# Patient Record
Sex: Female | Born: 1980 | Hispanic: Refuse to answer | Marital: Married | Smoking: Never smoker
Health system: Southern US, Community
[De-identification: ages and names within clinical notes are randomized; demographics above are authoritative.]

## PROBLEM LIST (undated history)

## (undated) DIAGNOSIS — O009 Unspecified ectopic pregnancy without intrauterine pregnancy: Secondary | ICD-10-CM

## (undated) HISTORY — PX: UNILATERAL SALPINGECTOMY: SHX6160

## (undated) HISTORY — DX: Unspecified ectopic pregnancy without intrauterine pregnancy: O00.90

---

## 2008-10-21 ENCOUNTER — Inpatient Hospital Stay: Payer: Self-pay | Admitting: Unknown Physician Specialty

## 2009-04-01 ENCOUNTER — Encounter: Payer: Self-pay | Admitting: Maternal and Fetal Medicine

## 2009-04-03 ENCOUNTER — Ambulatory Visit: Payer: Self-pay | Admitting: Maternal and Fetal Medicine

## 2009-06-13 ENCOUNTER — Ambulatory Visit: Payer: Self-pay | Admitting: Obstetrics and Gynecology

## 2009-06-17 ENCOUNTER — Ambulatory Visit: Payer: Self-pay | Admitting: Cardiovascular Disease

## 2009-06-27 ENCOUNTER — Encounter: Payer: Self-pay | Admitting: Maternal & Fetal Medicine

## 2009-08-06 ENCOUNTER — Other Ambulatory Visit: Payer: Self-pay

## 2009-08-12 ENCOUNTER — Observation Stay: Payer: Self-pay

## 2009-08-13 ENCOUNTER — Observation Stay: Payer: Self-pay | Admitting: Obstetrics and Gynecology

## 2009-08-22 ENCOUNTER — Inpatient Hospital Stay: Payer: Self-pay

## 2010-07-24 IMAGING — US US OB < 14 WEEKS - US OB TV
1 series · 17 of 28 positions shown · non-contrast
Comparison: none

REASON FOR EXAM: vaginal bleeding  LMP 8 weeks ago   Flex 2
COMMENTS:

[Series 1: us ob < 14 weeks - us ob tv · 17 of 93 slices shown]
[im 1/93]
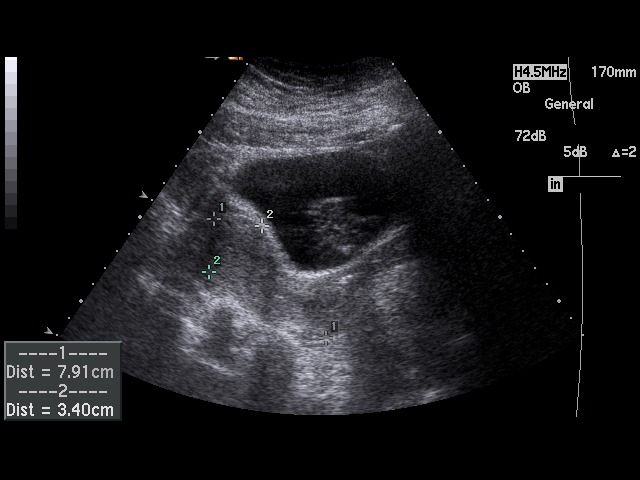
[im 7/93]
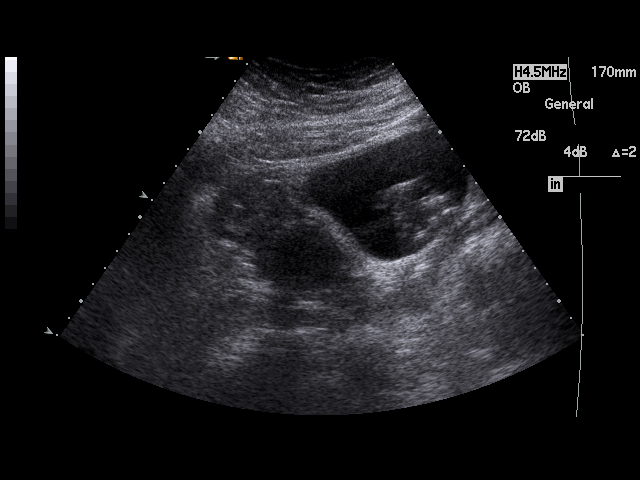
[im 14/93]
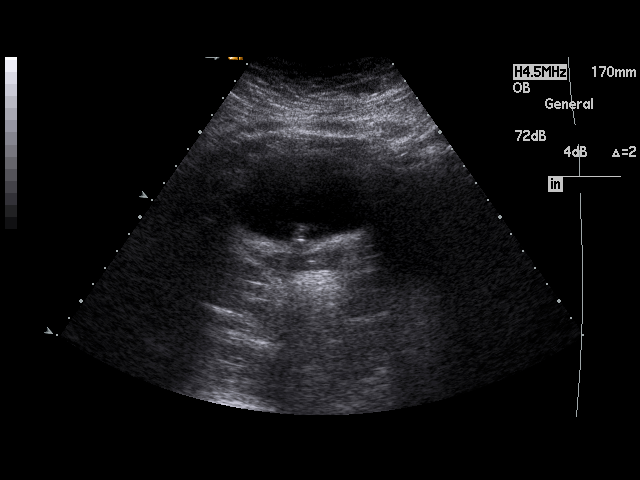
[im 18/93]
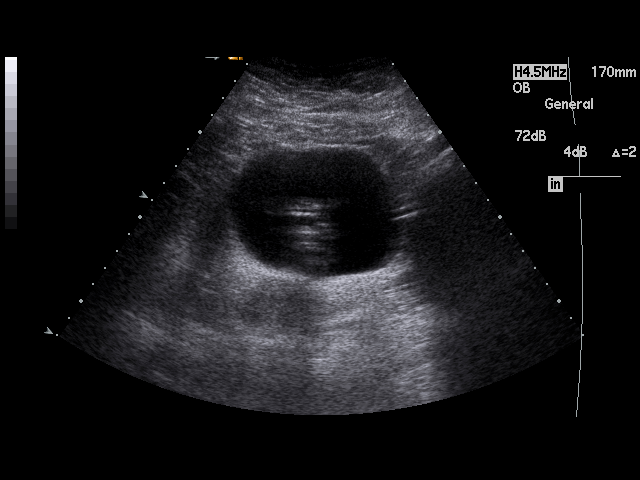
[im 24/93]
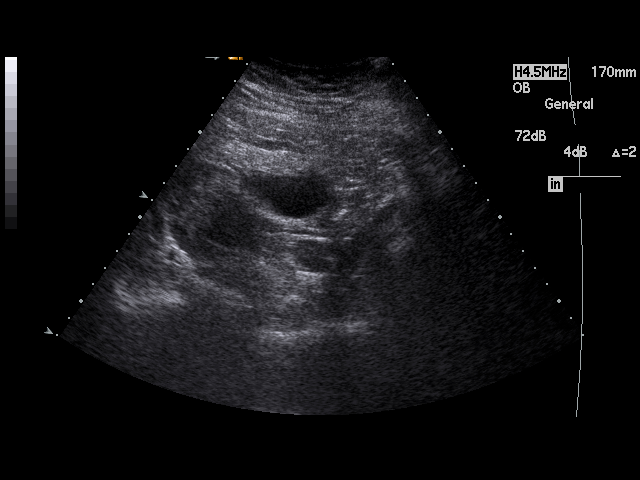
[im 31/93]
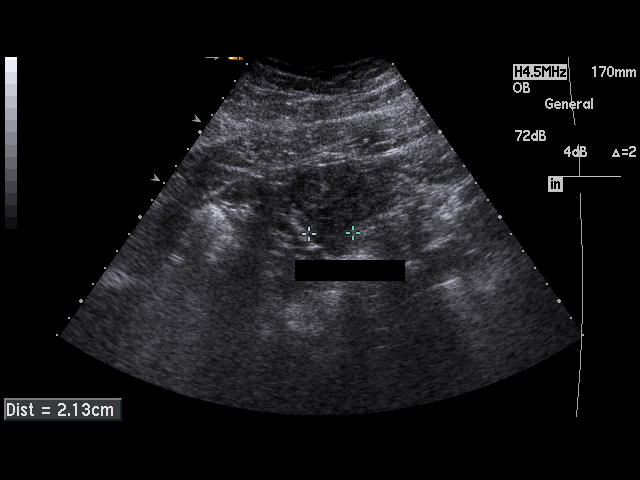
[im 35/93]
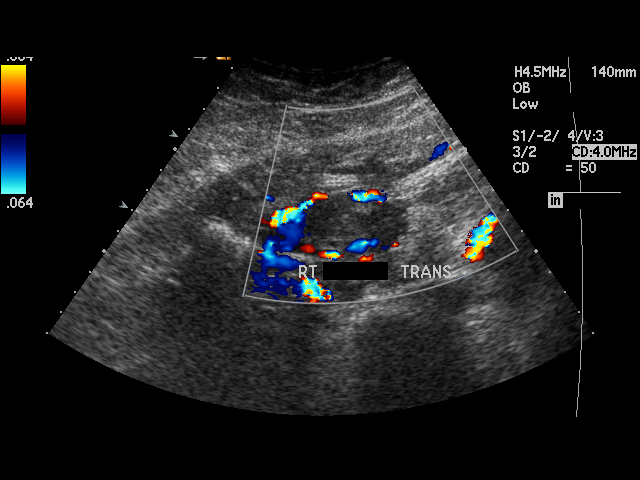
[im 41/93]
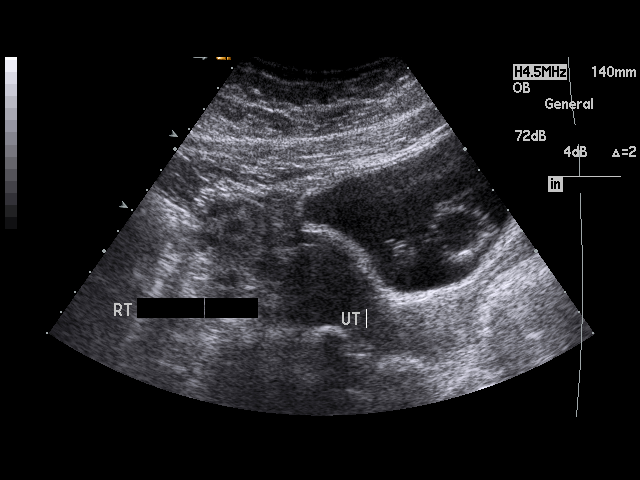
[im 48/93]
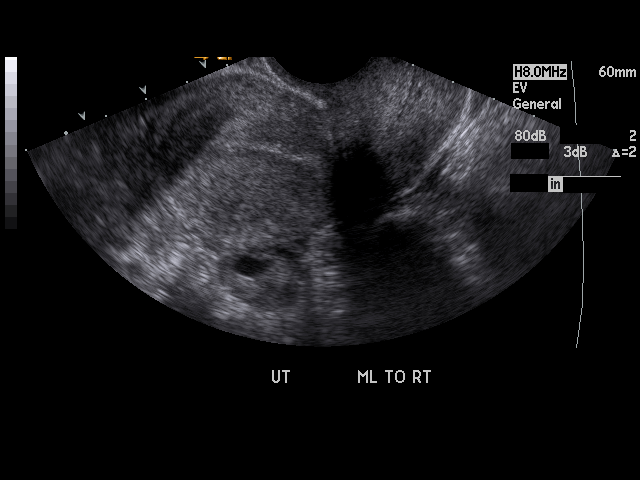
[im 52/93]
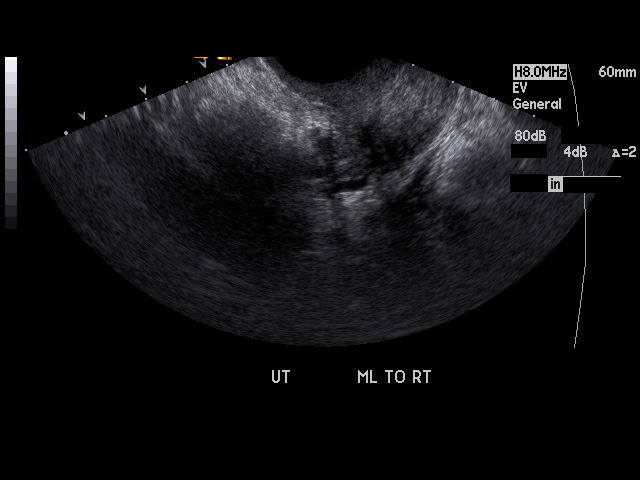
[im 58/93]
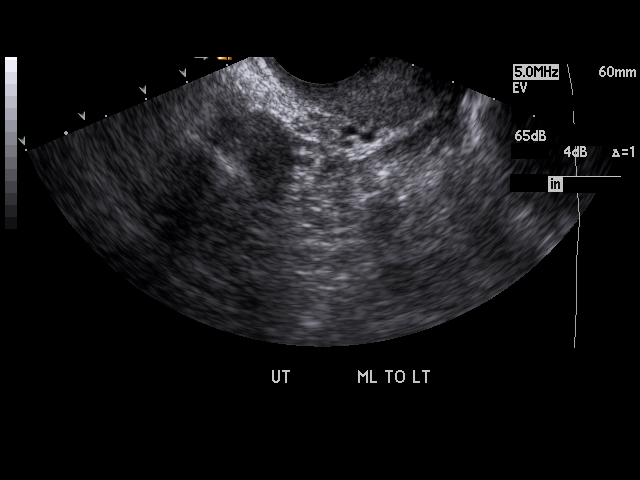
[im 62/93]
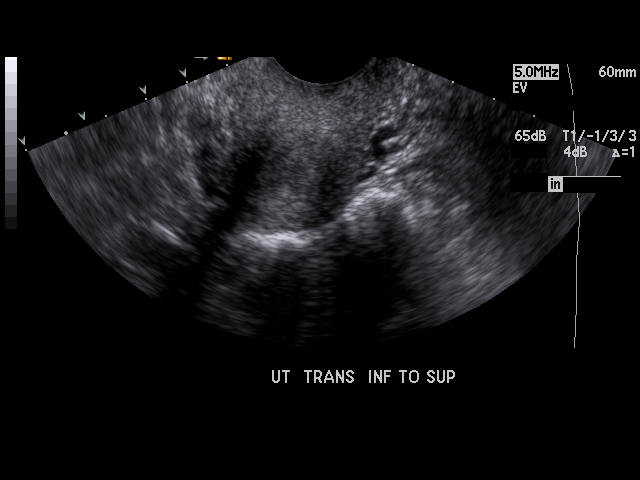
[im 69/93]
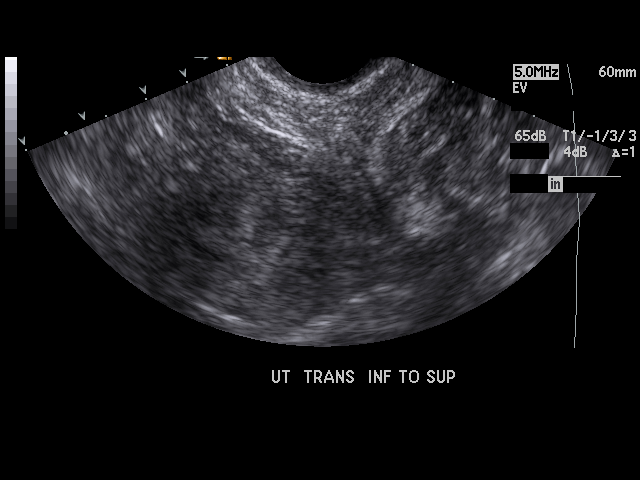
[im 75/93]
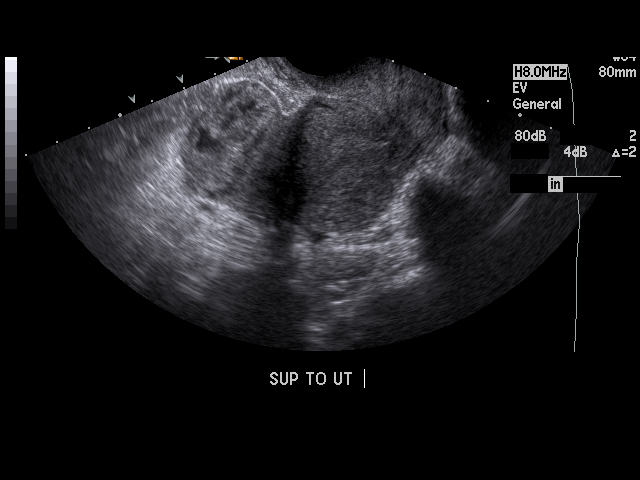
[im 79/93]
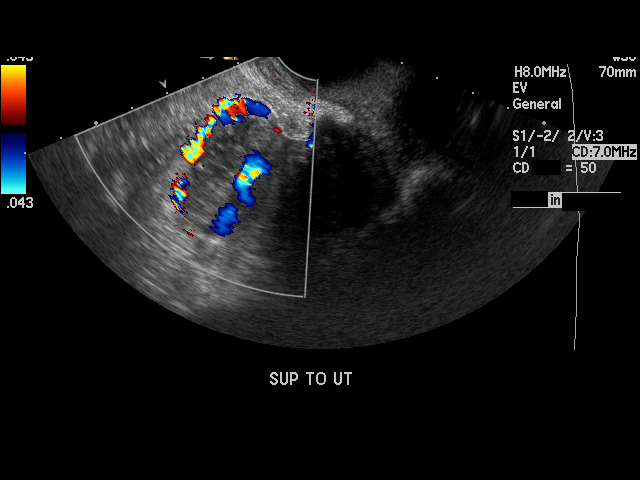
[im 86/93]
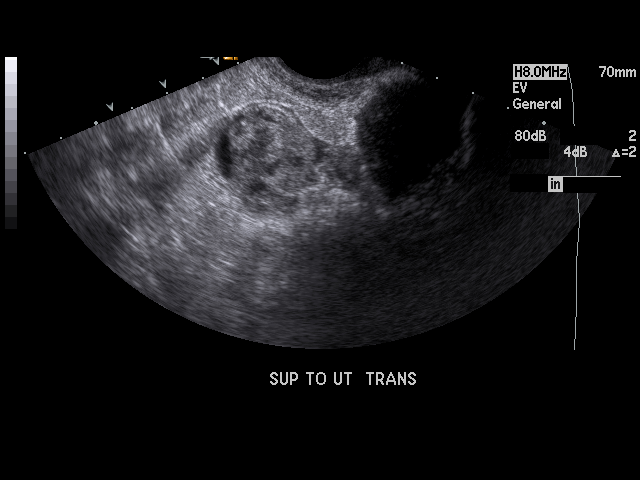
[im 93/93]
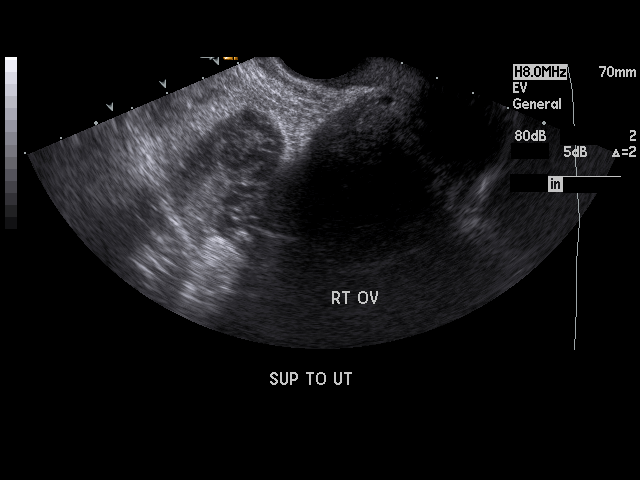

[17 of 28 positions shown; findings below may reference images not displayed]

PROCEDURE:     US  - US OB LESS THAN 14 WEEKS/W TRANS  - October 21, 2008  [DATE]

RESULT:     The uterus is empty. The endometrial stripe is not thickened. In
the right adnexal region there is a hypervascular area of heterogeneous
echogenicity that could reflect an ectopic pregnancy in the appropriate
clinical setting. The ovaries are demonstrated and appear normal in
echotexture and contour. The right ovary measures approximately 2.9 x 1.6 x
2.1 cm. The left ovary measures 1.9 x 1.5 x 1.9 cm. No significant free
fluid is demonstrated in the cul-de-sac.
IMPRESSION: There is complex appearing mixed echogenicity structure
that is hypervascular in the right adnexal region. In the appropriate
clinical setting this could reflect an early ectopic pregnancy. There is no
evidence of an IUP.
2. The ovaries are normal in echotexture and contour.

## 2011-01-02 IMAGING — US US OB DETAIL+14 WK - NRPT [facility]
1 series · 14 of 28 positions shown · non-contrast
Comparison: none

[Series 1: us ob detail+14 wk - nrpt [facility] · 14 of 78 slices shown]
[im 3/78]
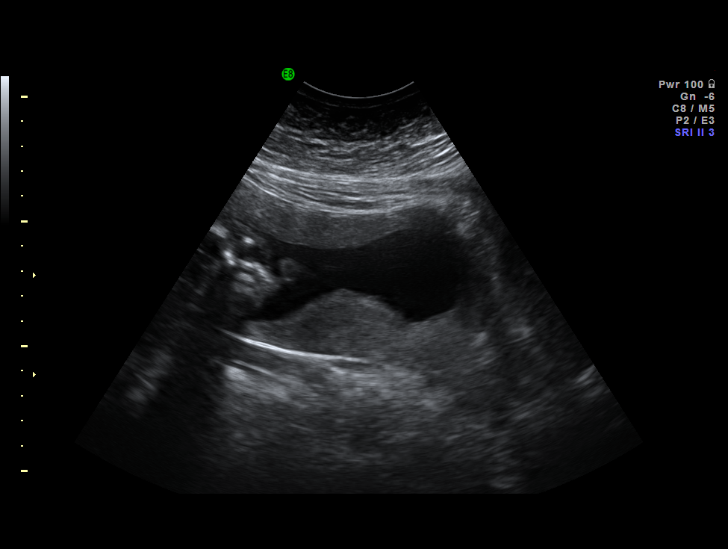
[im 9/78]
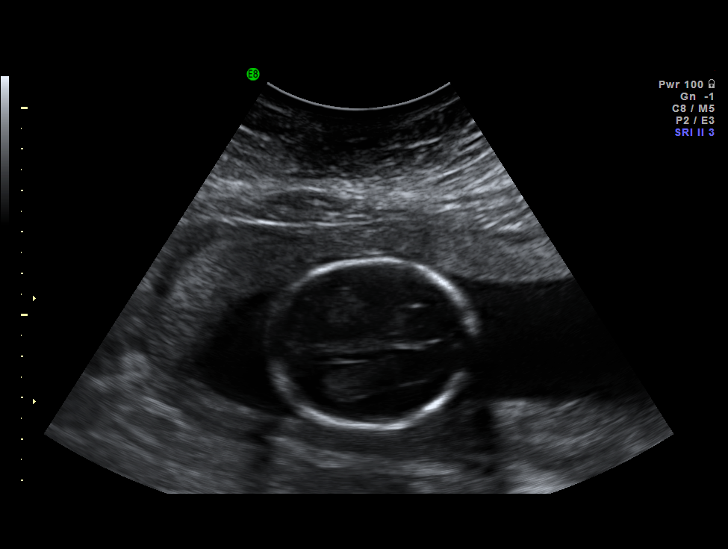
[im 15/78]
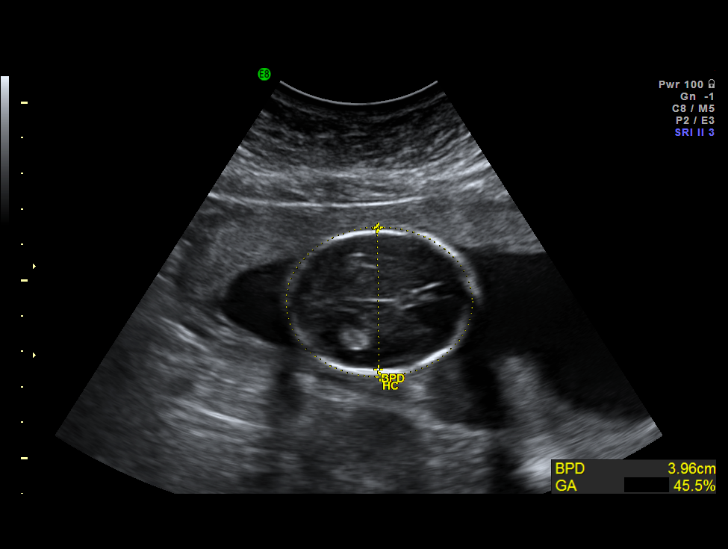
[im 20/78]
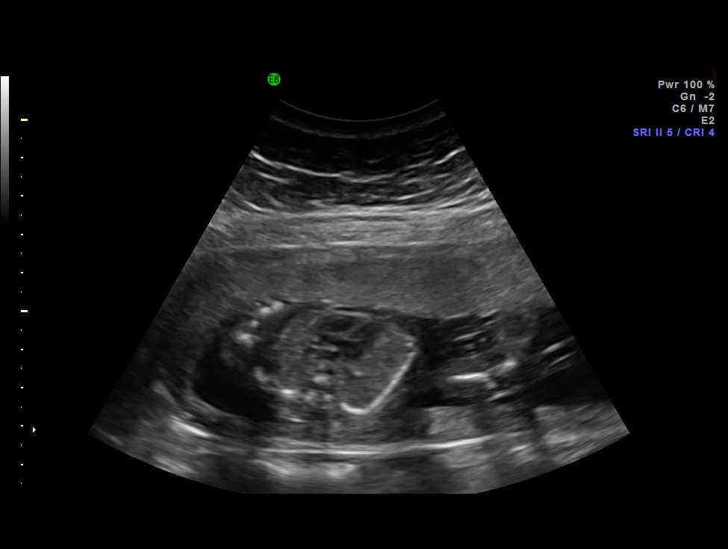
[im 26/78]
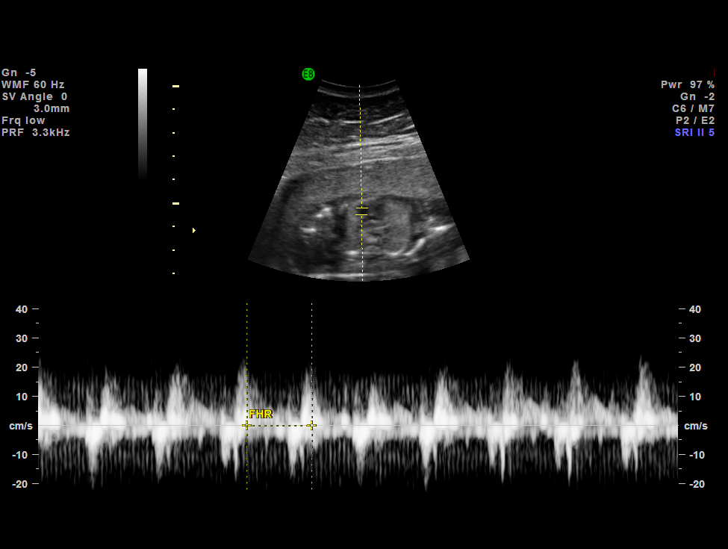
[im 32/78]
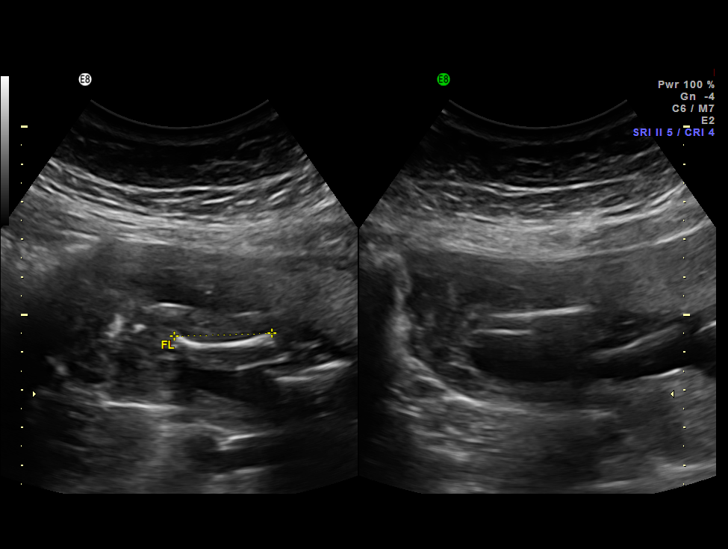
[im 38/78]
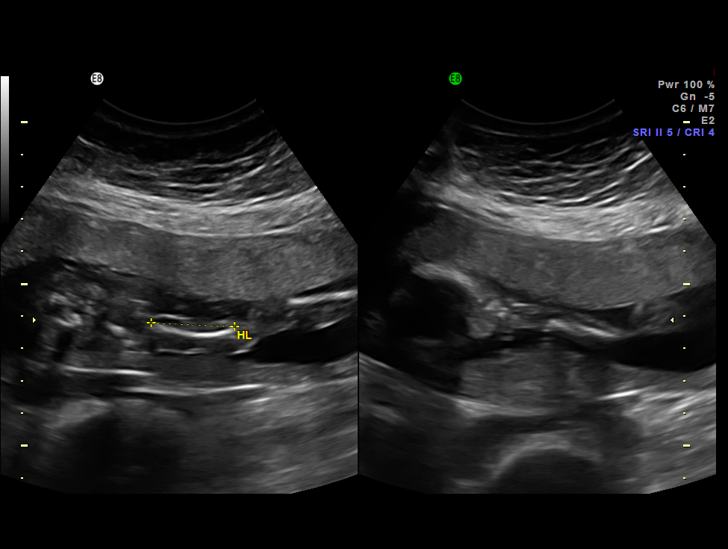
[im 43/78]
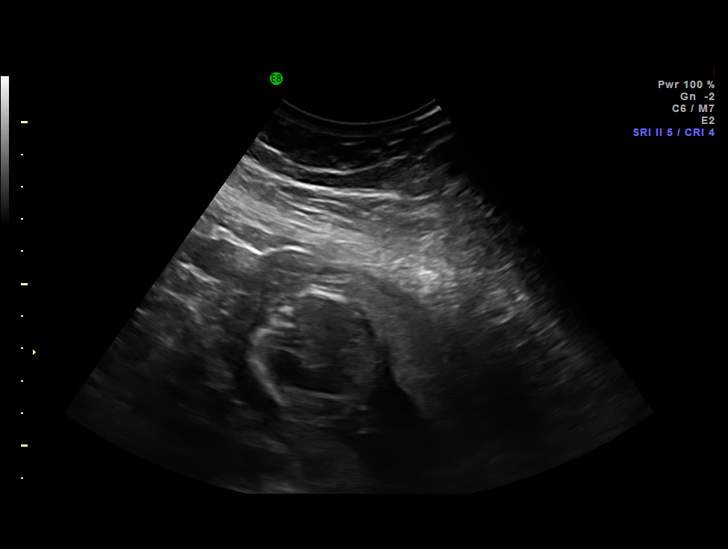
[im 49/78]
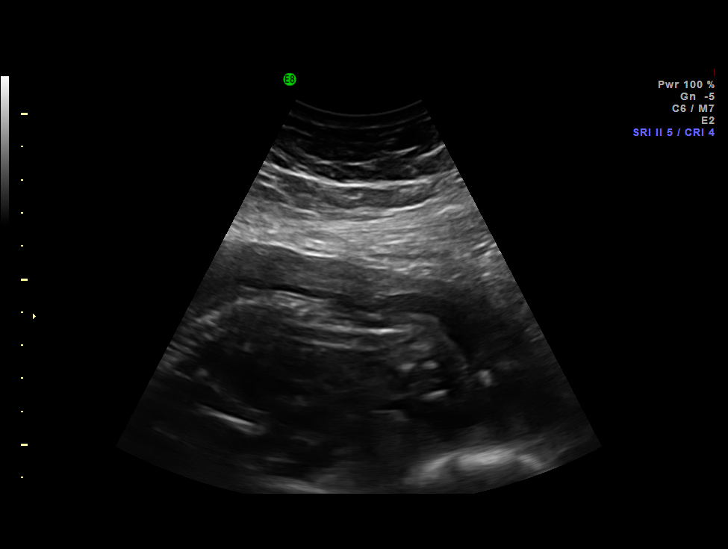
[im 55/78]
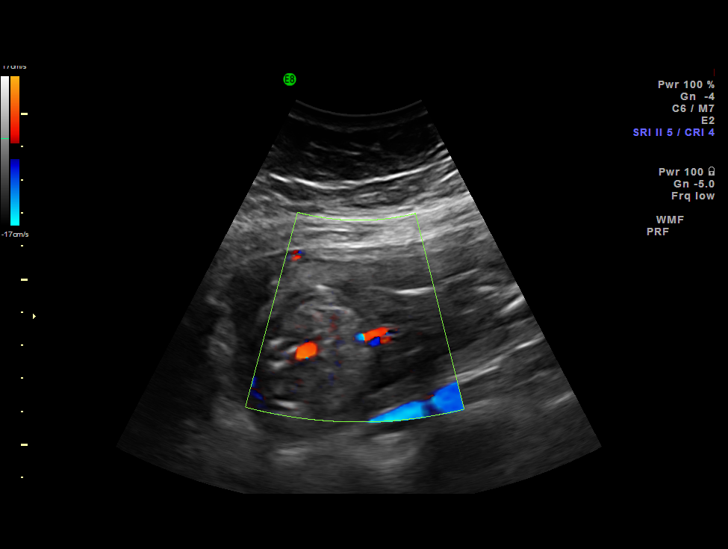
[im 60/78]
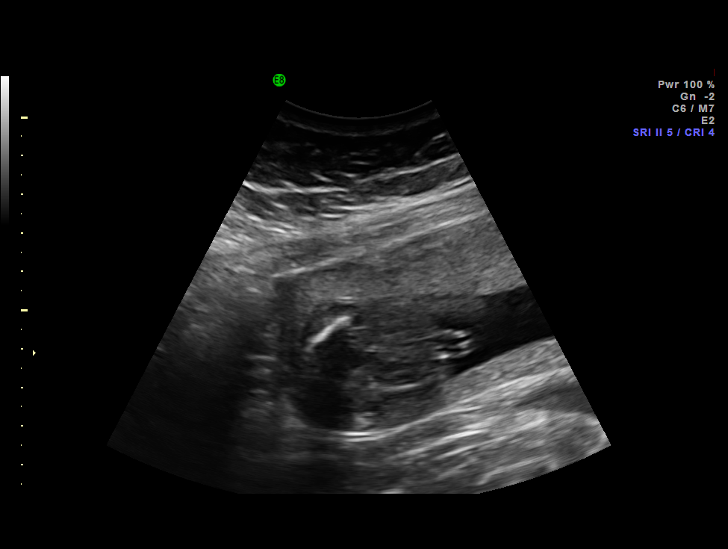
[im 66/78]
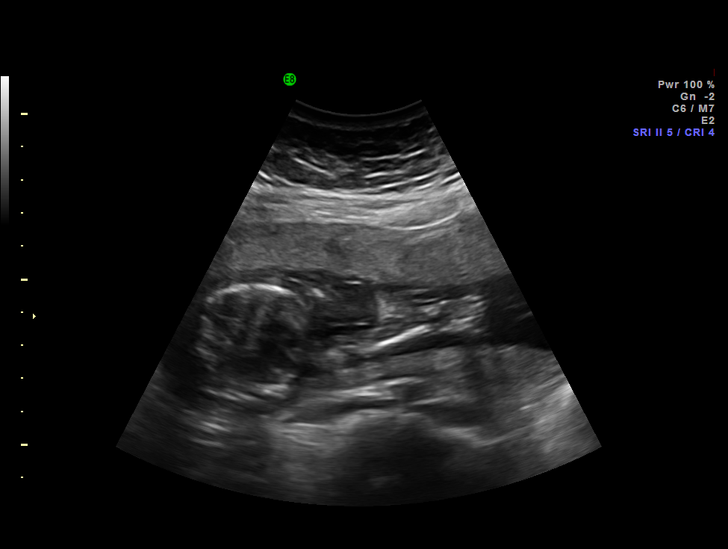
[im 72/78]
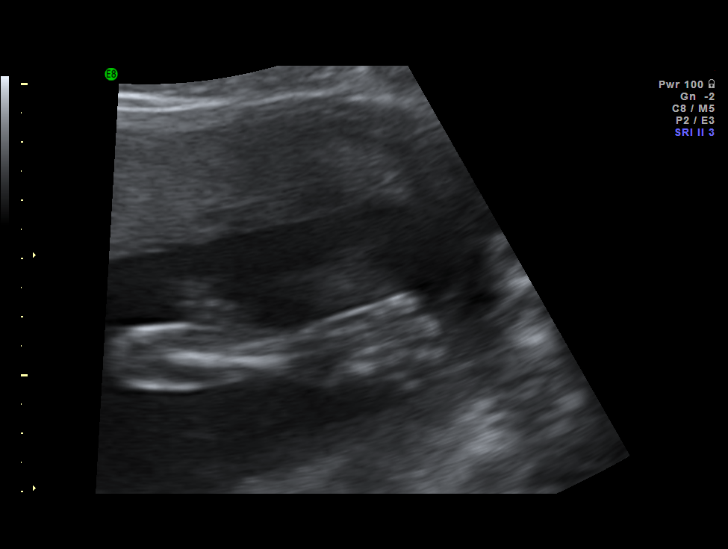
[im 78/78]
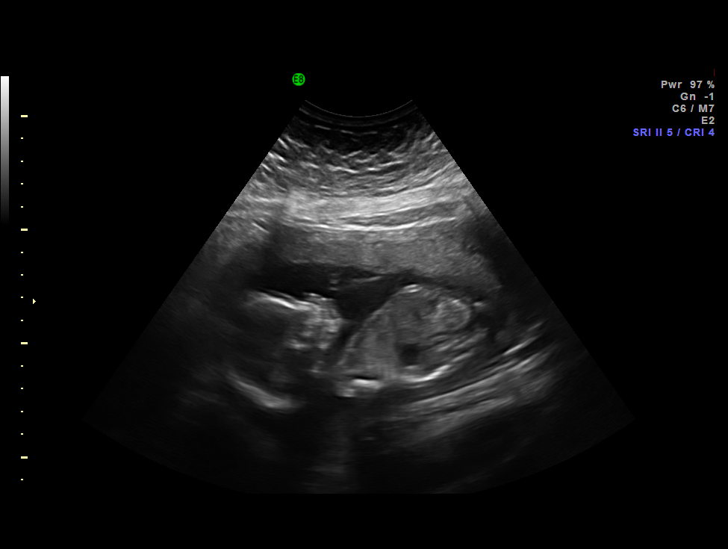

[14 of 28 positions shown; findings below may reference images not displayed]

IMAGES IMPORTED FROM THE SYNGO WORKFLOW SYSTEM
NO DICTATION FOR STUDY

## 2011-03-05 ENCOUNTER — Ambulatory Visit: Payer: Self-pay | Admitting: Family Medicine

## 2011-03-30 IMAGING — US US OB FOLLOW-UP - [facility]PT [facility]
1 series · 14 of 28 positions shown · non-contrast
Comparison: none

[Series 1: us ob follow-up - nrpt [facility] · 14 of 62 slices shown]
[im 3/62]
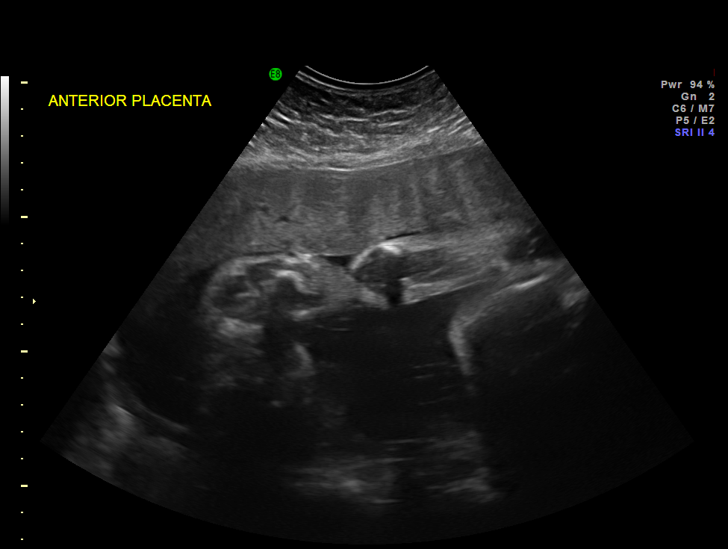
[im 7/62]
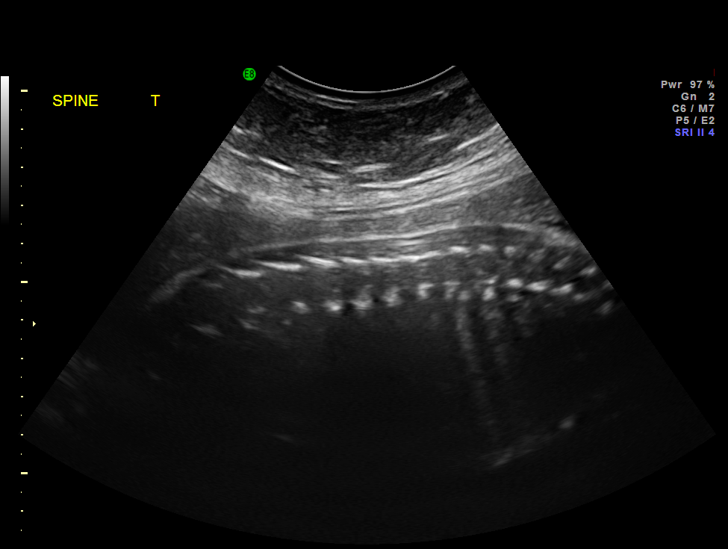
[im 12/62]
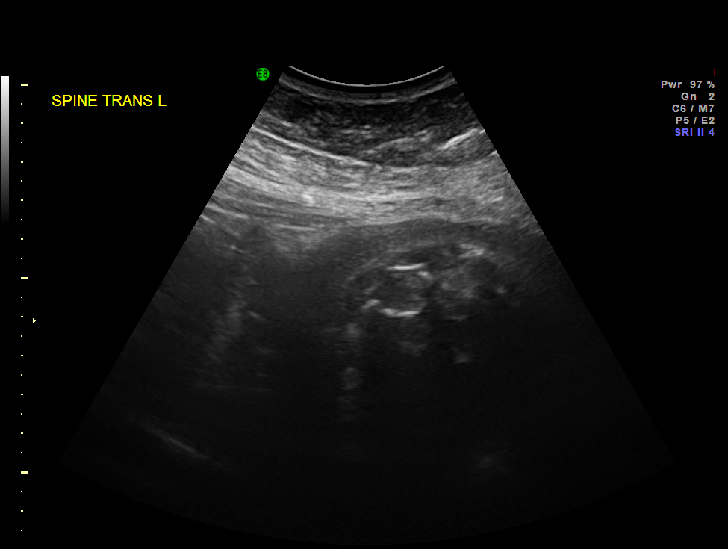
[im 16/62]
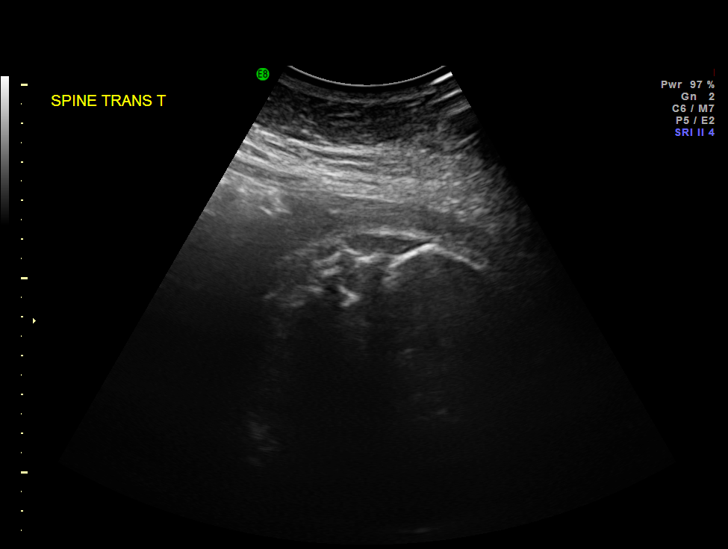
[im 21/62]
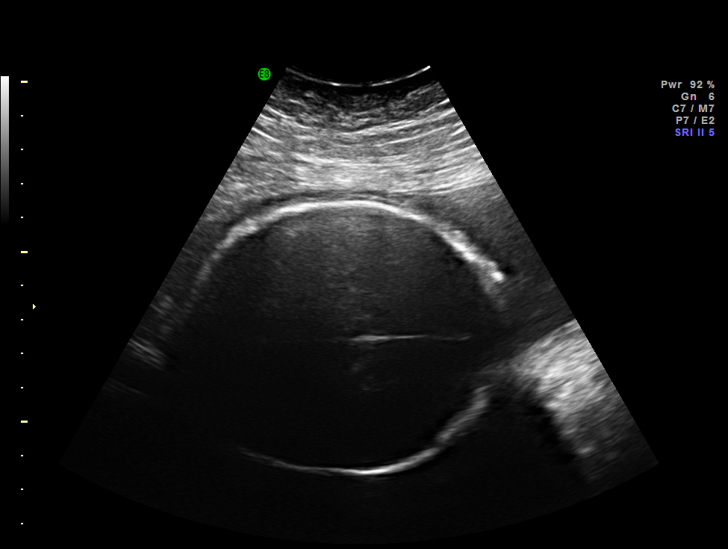
[im 25/62]
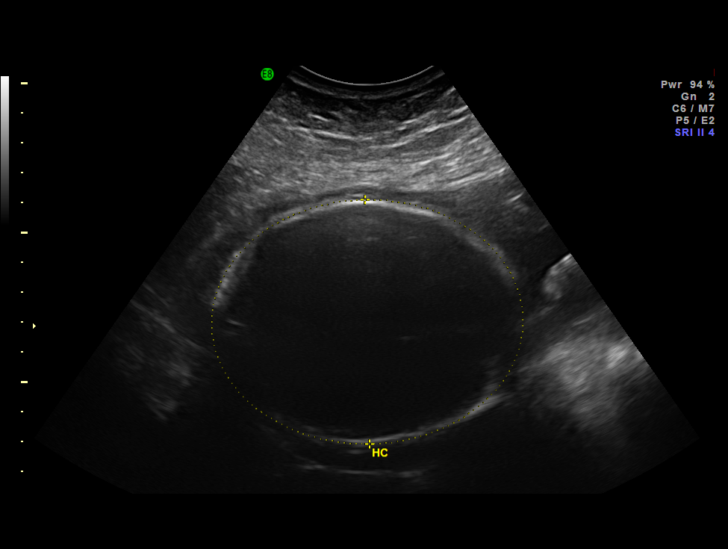
[im 30/62]
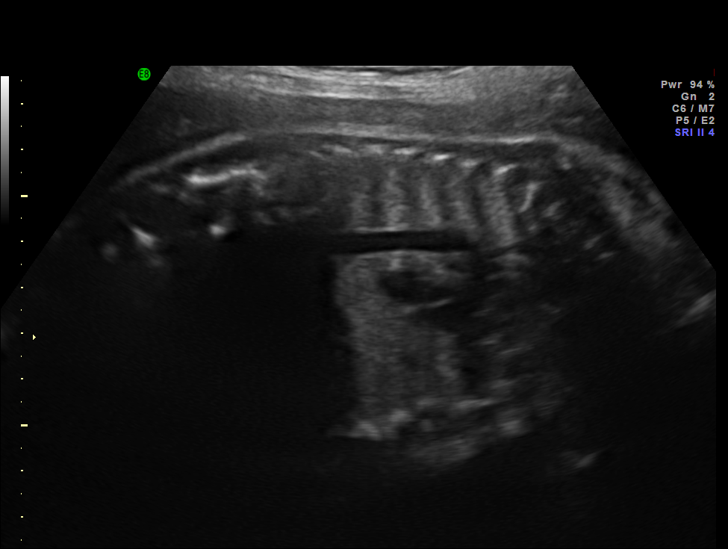
[im 34/62]
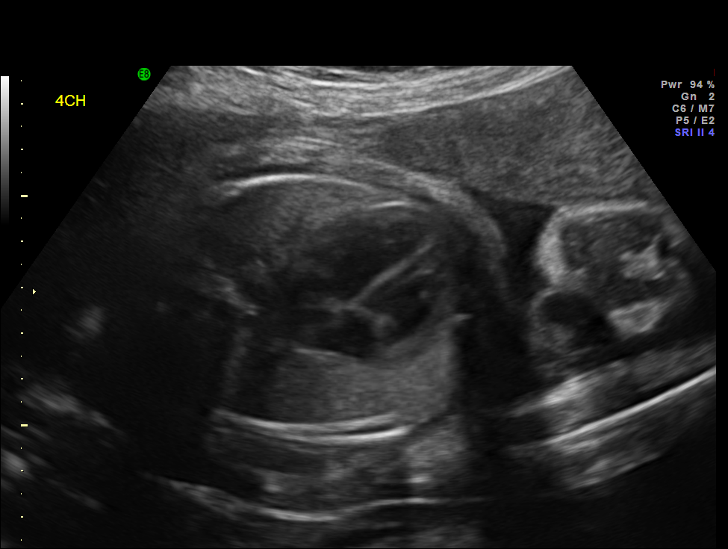
[im 39/62]
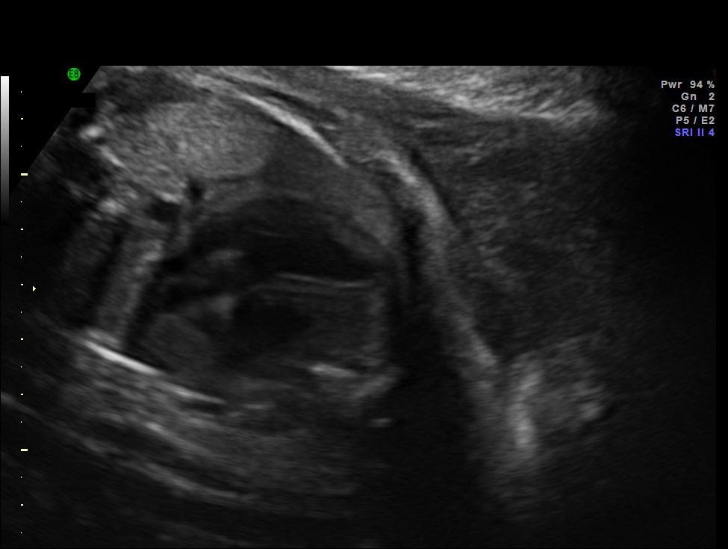
[im 43/62]
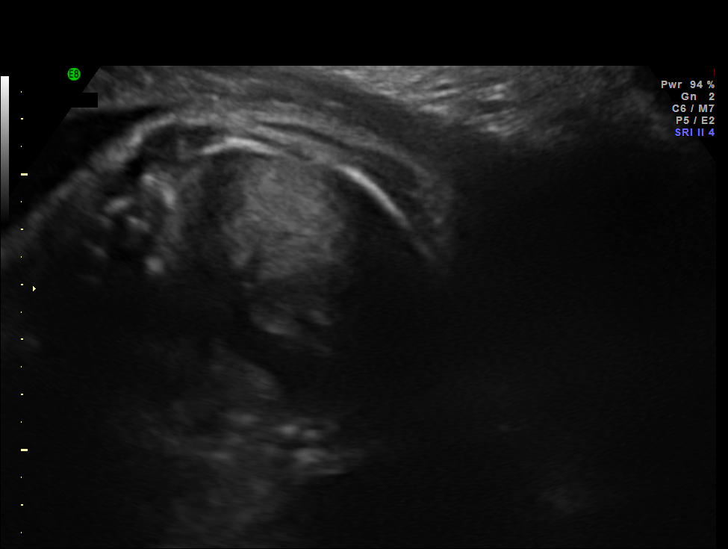
[im 48/62]
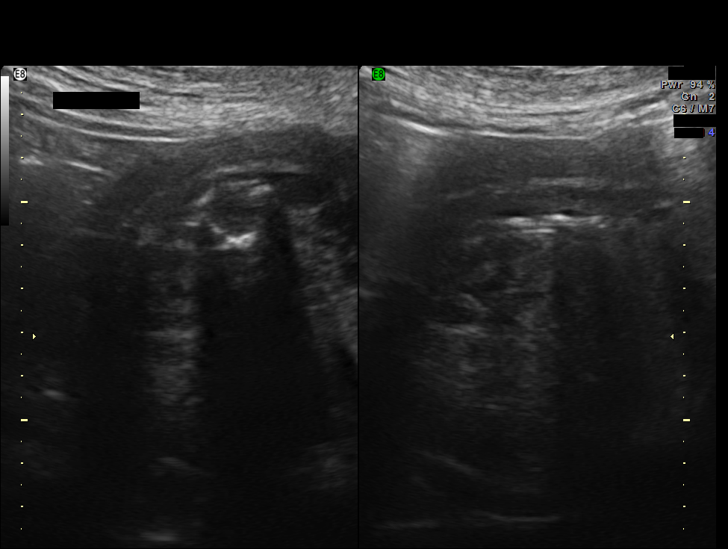
[im 52/62]
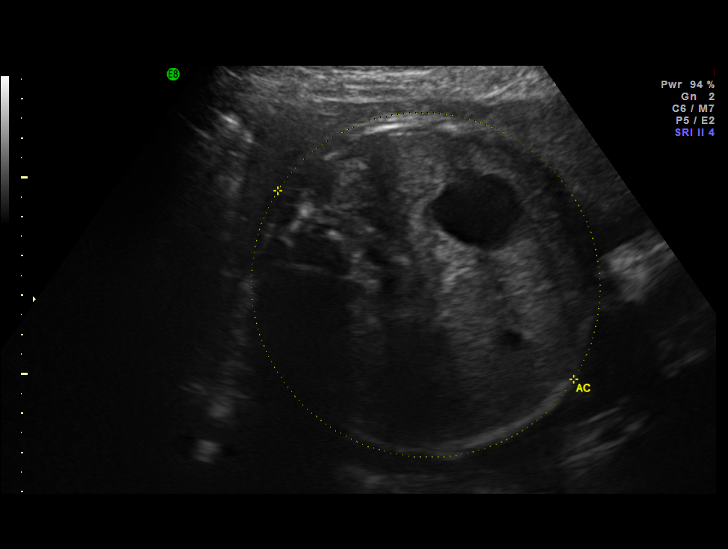
[im 57/62]
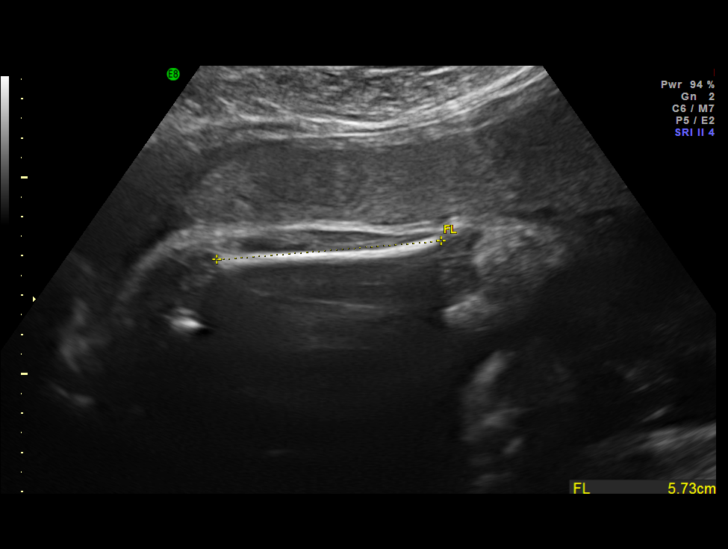
[im 62/62]
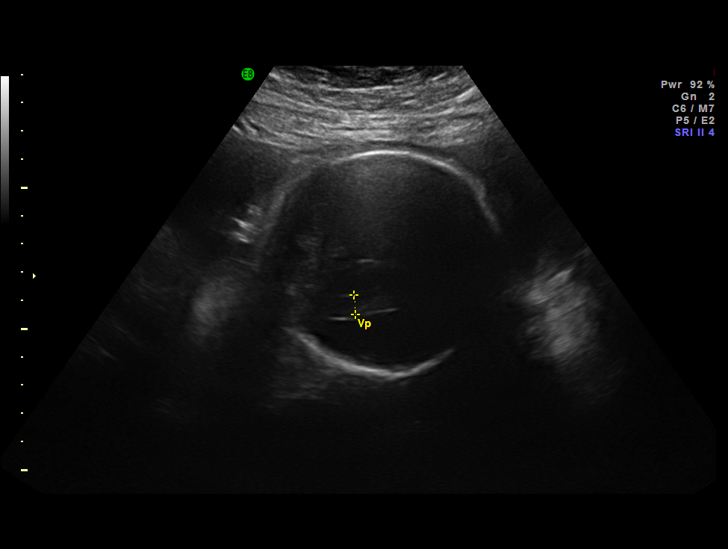

[14 of 28 positions shown; findings below may reference images not displayed]

IMAGES IMPORTED FROM THE SYNGO WORKFLOW SYSTEM
NO DICTATION FOR STUDY

## 2014-05-17 ENCOUNTER — Ambulatory Visit (INDEPENDENT_AMBULATORY_CARE_PROVIDER_SITE_OTHER): Payer: No Typology Code available for payment source | Admitting: Podiatry

## 2014-05-17 ENCOUNTER — Encounter: Payer: Self-pay | Admitting: Podiatry

## 2014-05-17 VITALS — Ht 64.0 in | Wt 193.0 lb

## 2014-05-17 DIAGNOSIS — L600 Ingrowing nail: Secondary | ICD-10-CM | POA: Diagnosis not present

## 2014-05-17 NOTE — Progress Notes (Signed)
   Subjective:    Patient ID: Emma Rush, female    DOB: 1980/06/24, 34 y.o.   MRN: [id number]  HPI 34 year old female presents the office today with complaints of ingrown toenails the right medial nail border. She states over the weekend she started having increasing pain to the area. She denies any drainage or pus coming from the area. No history of injury or trauma. No prior treatment. No other complaints at this time.   Review of Systems  All other systems reviewed and are negative.      Objective:   Physical Exam AAO x3, NAD DP/PT pulses palpable bilaterally, CRT less than 3 seconds Protective sensation intact with Simms Weinstein monofilament, vibratory sensation intact, Achilles tendon reflex intact There is evidence of ingrowing along the medial border the right hallux toenail with tenderness palpation overlying this area. There is no surrounding edema, erythema, increase in warmth, drainage/purulence. There is no other areas to the remaining nails. No other areas of tenderness to bilateral lower extremities. MMT 5/5, ROM WNL.  No open lesions or pre-ulcerative lesions.  No overlying edema, erythema, increase in warmth to bilateral lower extremities.  No pain with calf compression, swelling, warmth, erythema bilaterally.      Assessment & Plan:  34 year old female with symptomatic right medial hallux ingrown toenail -Treatment options were discussed including alternatives, risks, complications -At this time, the patient is requesting partial nail removal with chemical matricectomy to right medial hallux nail border. Risks and complications were discussed with the patient for which they understand and  verbally consent to the procedure. Under sterile conditions a total of 3 mL of a mixture of 2% lidocaine plain and 0.5% Marcaine plain was infiltrated in a hallux block fashion. Once anesthetized, the skin was prepped in sterile fashion. A tourniquet was then applied. Next  the  medial hallux nail border was then sharply excised making sure to remove the entire offending nail border. Once the nails were ensured to be removed area was debrided and the underlying skin was intact. There is no purulence identified in the procedure. Next phenol was then applied under standard conditions and copiously irrigated. Silvadene was applied. A dry sterile dressing was applied. After application of the dressing the tourniquet was removed and there is found to be an immediate capillary refill time to the digit. The patient tolerated the procedure well any complications. Post procedure instructions were discussed the patient for which he verbally understood. Follow-up in one week for nail check or sooner if any problems are to arise. Discussed signs/symptoms of infection and directed to call the office immediately should any occur or go directly to the emergency room. In the meantime, encouraged to call the office with any questions, concerns, changes symptoms.

## 2014-05-17 NOTE — Patient Instructions (Signed)

## 2014-05-22 ENCOUNTER — Ambulatory Visit: Payer: No Typology Code available for payment source | Admitting: Podiatry

## 2014-05-29 ENCOUNTER — Ambulatory Visit (INDEPENDENT_AMBULATORY_CARE_PROVIDER_SITE_OTHER): Payer: No Typology Code available for payment source | Admitting: Podiatry

## 2014-05-29 DIAGNOSIS — Z9889 Other specified postprocedural states: Secondary | ICD-10-CM | POA: Diagnosis not present

## 2014-05-29 DIAGNOSIS — Q828 Other specified congenital malformations of skin: Secondary | ICD-10-CM

## 2014-05-30 NOTE — Progress Notes (Signed)
Patient ID: Emma HerrlichChristina Rush, female   DOB: [birth year], 34 y.o.   MRN: [id number]  Subjective: Patient presents to the office today for follow up status post right medial hallux partial nail avulsion with chemical matrixectomy. She states she is doing well without any complaints. She was soaking in epsom salts until Friday when she discontinued it as she believes it was healed. She denies any pain to the area and denies any drainage or any surrounding redness. She also states that she had a small lesion on the bottom of her left foot that she went to look at while years. She states that she has pain in this area particular pressure and ambulation. No other complaints at this time.  Objective: AAO 3, NAD Neurovascular status unchanged Status post partial nail avulsion right medial hallux which is healed at this time. There is a scab overlying the procedure site. There is no tenderness to palpation overlying the area and there is no surrounding erythema, ascending cellulitis, drainage/purulence, malodor. On the left foot submetatarsal 2 there is a small punctate hyperkeratotic lesion. Upon debridement there is no underlying ulceration, drainage and there is no pinpoint bleeding or evidence of verruca. No other open lesions or pre-ulcerative lesions No pain with calf compression, swelling, warmth, erythema.   Assessment: Follow-up evaluation s/p nail removal; left porokeratosis  Plan: -Treatment options were discussed with patient include alternatives, risks, complications -At this time the procedure site on the right hallux nail appears to be healed. Continues monitoring signs or symptoms of infection and directed to monitor for any reoccurrence. -Lesion on the left sharply debrided without complications.  -Follow-up as needed. In the meantime call the office with any questions, concerns, change in symptoms.

## 2015-05-13 ENCOUNTER — Emergency Department: Payer: No Typology Code available for payment source

## 2015-05-13 ENCOUNTER — Encounter: Payer: Self-pay | Admitting: Emergency Medicine

## 2015-05-13 ENCOUNTER — Emergency Department
Admission: EM | Admit: 2015-05-13 | Discharge: 2015-05-13 | Disposition: A | Discharge status: Home / Self Care | Payer: No Typology Code available for payment source | Attending: Emergency Medicine | Admitting: Emergency Medicine

## 2015-05-13 DIAGNOSIS — W28XXXA Contact with powered lawn mower, initial encounter: Secondary | ICD-10-CM | POA: Insufficient documentation

## 2015-05-13 DIAGNOSIS — M26621 Arthralgia of right temporomandibular joint: Secondary | ICD-10-CM | POA: Diagnosis not present

## 2015-05-13 DIAGNOSIS — S0990XA Unspecified injury of head, initial encounter: Secondary | ICD-10-CM | POA: Diagnosis present

## 2015-05-13 DIAGNOSIS — Y9355 Activity, bike riding: Secondary | ICD-10-CM | POA: Insufficient documentation

## 2015-05-13 DIAGNOSIS — S0083XA Contusion of other part of head, initial encounter: Secondary | ICD-10-CM | POA: Insufficient documentation

## 2015-05-13 DIAGNOSIS — Y92009 Unspecified place in unspecified non-institutional (private) residence as the place of occurrence of the external cause: Secondary | ICD-10-CM | POA: Diagnosis not present

## 2015-05-13 DIAGNOSIS — Y999 Unspecified external cause status: Secondary | ICD-10-CM | POA: Diagnosis not present

## 2015-05-13 DIAGNOSIS — S0093XA Contusion of unspecified part of head, initial encounter: Secondary | ICD-10-CM

## 2015-05-13 NOTE — ED Notes (Signed)
States she was mowing on a hill and fell backwards  Hit head on ground  Having right jaw pain and tenderness to left thigh  Denies any LOC

## 2015-05-13 NOTE — ED Provider Notes (Signed)
[facility] Emergency Department Provider Note ____________________________________________  Time seen: 1559  I have reviewed the triage vital signs and the nursing notes.  HISTORY  Chief Complaint  Fall  HPI Emma Rush is a 35 y.o. female presents to the ED from personal vehicle, by her husband and child for evaluation of injury sustained following a fall off of her riding lawnmower. Patient describes that about an hour and half prior to arrival, she was using her riding a lot more in the backyard. She had approached an incline in the yard and recognized that the tractor was going to tip. She goal of the tractor, and fell backwards onto the ground. She recalls hitting the back of her head and likely clenching her jaw at the same time. She presents to the ED primarily with pain to the right jaw at the TMJ which is worsened by clenching teeth. She denies any nausea, vomiting, dizziness. She also denies any loss of consciousness, distal paresthesias, or other injuries. She was ambulatory to the house after the fall, was able to call person on the phone and relayed details of the accident to him. She presents today with pain primarily to the right jaw described as tightness, that she rates at a4/10 in triage.  History reviewed. No pertinent past medical history.  There are no active problems to display for this patient.  History reviewed. No pertinent past surgical history.  No current outpatient prescriptions on file.  Allergies Review of patient's allergies indicates no known allergies.  No family history on file.  Social History Social History  Substance Use Topics  . Smoking status: Never Smoker   . Smokeless tobacco: Never Used  . Alcohol Use: No   Review of Systems  Constitutional: Negative for fever. Eyes: Negative for visual changes. ENT: Negative for sore throat. Cardiovascular: Negative for chest pain. Respiratory: Negative for shortness of  breath. Gastrointestinal: Negative for abdominal pain, vomiting and diarrhea. Musculoskeletal: Negative for back pain. right jaw pain as above.  Skin: Negative for rash. Neurological: Negative for headaches, focal weakness or numbness. ____________________________________________  PHYSICAL EXAM:  VITAL SIGNS: ED Triage Vitals  Enc Vitals Group     BP 05/13/15 1541 149/100 mmHg     Pulse Rate 05/13/15 1541 66     Resp 05/13/15 1541 18     Temp 05/13/15 1541 99 F (37.2 C)     Temp Source 05/13/15 1541 Oral     SpO2 05/13/15 1541 100 %     Weight 05/13/15 1541 195 lb (88.451 kg)     Height 05/13/15 1541 5\' 4"  (1.626 m)     Head Cir --      Peak Flow --      Pain Score 05/13/15 1541 4     Pain Loc --      Pain Edu? --      Excl. in GC? --    Constitutional: Alert and oriented4. Well appearing and in no distress. Head: Normocephalic and atraumatic. No bruise, abrasion, hematoma noted to the crown of the head. Patient with normal articulation of the jaw without crepitus. She is mildly tender to palpation over the right tragus.       Eyes: Conjunctivae are normal. PERRL. Normal extraocular movements and fundi bilaterally.       Ears: Canals clear. TMs intact bilaterally.   Nose: No congestion/rhinorrhea.   Mouth/Throat: Mucous membranes are moist. uvula is midline and tonsils are flat. No malocclusion of the lower jaw is appreciated.  Neck: Supple. No thyromegaly. Hematological/Lymphatic/Immunological: No cervical lymphadenopathy. Cardiovascular: Normal rate, regular rhythm.  Respiratory: Normal respiratory effort. No wheezes/rales/rhonchi. Gastrointestinal: Soft and nontender. No distention no rebound, guarding, organomegaly . Musculoskeletal: normal spinal alignment without midline tenderness, spasm, deformity, or step-off. Normal transition from sit to stand without difficulty. Patient with normal flexion and extension range at the spine. Nontender with normal range of  motion in all extremities.  Neurologic: cranial nerves II through XII grossly intact. Normal DTRs of the UE & LE bilaterally. Normal intrinsic and opposition testing. No cerebellar ataxia on exam. Negative Romberg. Normal alternating rapid movements assessed.  Normal gait without ataxia. Normal speech and language. No gross focal neurologic deficits are appreciated. Skin:  Skin is warm, dry and intact. No rash noted. Psychiatric: Mood and affect are normal. Patient exhibits appropriate insight and judgment. ____________________________________________   RADIOLOGY  Mandible IMPRESSION: No acute abnormality noted.  I, Emmi Wertheim, Charlesetta Ivory, personally viewed and evaluated these images (plain radiographs) as part of my medical decision making, as well as reviewing the written report by the radiologist. ____________________________________________  INITIAL IMPRESSION / ASSESSMENT AND PLAN / ED COURSE  Patient with a fall off a riding lawn more resulting in a very minor head contusion and jaw strain. No radiologic evidence of fracture or dislocation to the TMJ.  She will be discharged with instructions for management of jaw pain as well as instruction on minor head injury concussion precautions. Exam is otherwise normal without any neuromuscular deficit or signs of cerebellar ataxia. Patient will be discharged with instructions to follow-up with [facility] as needed for ongoing symptom management. She is return to the ED for worsening symptoms as discussed.  ____________________________________________  FINAL CLINICAL IMPRESSION(S) / ED DIAGNOSES  Final diagnoses:  Contact with powered lawnmower as cause of accidental injury at home as place of occurrence  Arthralgia of right temporomandibular joint  Head contusion, initial encounter      Lissa Hoard, PA-C 05/13/15 1757  Minna Antis, MD 05/13/15 2125

## 2015-05-13 NOTE — Discharge Instructions (Signed)
Facial or Scalp Contusion  A facial or scalp contusion is a deep bruise on the face or head. Contusions happen when an injury causes bleeding under the skin. Signs of bruising include pain, puffiness (swelling), and discolored skin. The contusion may turn blue, purple, or yellow. HOME CARE  Only take medicines as told by your doctor.  Put ice on the injured area.  Put ice in a plastic bag.  Place a towel between your skin and the bag.  Leave the ice on for 20 minutes, 2-3 times a day. GET HELP IF:  You have bite problems.  You have pain when chewing.  You are worried about your face not healing normally. GET HELP RIGHT AWAY IF:   You have severe pain or a headache and medicine does not help.  You are very tired or confused, or your personality changes.  You throw up (vomit).  You have a nosebleed that will not stop.  You see two of everything (double vision) or have blurry vision.  You have fluid coming from your nose or ear.  You have problems walking or using your arms or legs. MAKE SURE YOU:   Understand these instructions.  Will watch your condition.  Will get help right away if you are not doing well or get worse.   This information is not intended to replace advice given to you by your health care provider. Make sure you discuss any questions you have with your health care provider.   Document Released: 01/01/2011 Document Revised: 02/02/2014 Document Reviewed: 08/25/2012 Elsevier Interactive Patient Education 2016 [place] Injury, Adult You have a head injury. Headaches and throwing up (vomiting) are common after a head injury. It should be easy to wake up from sleeping. Sometimes you must stay in the hospital. Most problems happen within the first 24 hours. Side effects may occur up to 7-10 days after the injury.  WHAT ARE THE TYPES OF HEAD INJURIES? Head injuries can be as minor as a bump. Some head injuries can be more severe. More severe head  injuries include:  A jarring injury to the brain (concussion).  A bruise of the brain (contusion). This mean there is bleeding in the brain that can cause swelling.  A cracked skull (skull fracture).  Bleeding in the brain that collects, clots, and forms a bump (hematoma). WHEN SHOULD I GET HELP RIGHT AWAY?   You are confused or sleepy.  You cannot be woken up.  You feel sick to your stomach (nauseous) or keep throwing up (vomiting).  Your dizziness or unsteadiness is getting worse.  You have very bad, lasting headaches that are not helped by medicine. Take medicines only as told by your doctor.  You cannot use your arms or legs like normal.  You cannot walk.  You notice changes in the black spots in the center of the colored part of your eye (pupil).  You have clear or bloody fluid coming from your nose or ears.  You have trouble seeing. During the next 24 hours after the injury, you must stay with someone who can watch you. This person should get help right away (call 911 in the [place] if you start to shake and are not able to control it (have seizures), you pass out, or you are unable to wake up. HOW CAN I PREVENT A HEAD INJURY IN THE FUTURE?  Wear seat belts.  Wear a helmet while bike riding and playing sports like football.  Stay away from dangerous  activities around the house. WHEN CAN I RETURN TO NORMAL ACTIVITIES AND ATHLETICS? See your doctor before doing these activities. You should not do normal activities or play contact sports until 1 week after the following symptoms have stopped:  Headache that does not go away.  Dizziness.  Poor attention.  Confusion.  Memory problems.  Sickness to your stomach or throwing up.  Tiredness.  Fussiness.  Bothered by bright lights or loud noises.  Anxiousness or depression.  Restless sleep. MAKE SURE YOU:   Understand these instructions.  Will watch your condition.  Will get help right away if you are not  doing well or get worse.   This information is not intended to replace advice given to you by your health care provider. Make sure you discuss any questions you have with your health care provider.   Document Released: 12/26/2007 Document Revised: 02/02/2014 Document Reviewed: 09/19/2012 Elsevier Interactive Patient Education [employer]   Your exam and x-ray are essentially normal today. You have no evidence of jaw fracture or dislocation. You have no exam findings to indicate a serious brain injury. Take Tylenol or Motrin as needed for pain. Follow-up with your provider as needed. Apply ice to any sore muscles or joints as needed.

## 2019-07-14 ENCOUNTER — Other Ambulatory Visit: Payer: No Typology Code available for payment source

## 2020-06-06 ENCOUNTER — Other Ambulatory Visit: Payer: Self-pay

## 2020-06-06 ENCOUNTER — Ambulatory Visit (LOCAL_COMMUNITY_HEALTH_CENTER): Payer: Self-pay

## 2020-06-06 DIAGNOSIS — Z23 Encounter for immunization: Secondary | ICD-10-CM

## 2020-06-06 NOTE — Progress Notes (Signed)
Patient presented in nurse clinic for Tdap vaccine. She reports last Tdap in 2011. Immunization was administered and patient tolerated well. [facility] updated and copy given to patient.

## 2020-06-26 ENCOUNTER — Ambulatory Visit: Payer: Self-pay

## 2020-07-22 ENCOUNTER — Ambulatory Visit: Payer: Self-pay

## 2022-03-25 DIAGNOSIS — L720 Epidermal cyst: Secondary | ICD-10-CM | POA: Diagnosis not present

## 2022-03-25 DIAGNOSIS — L578 Other skin changes due to chronic exposure to nonionizing radiation: Secondary | ICD-10-CM | POA: Diagnosis not present

## 2022-03-25 DIAGNOSIS — R208 Other disturbances of skin sensation: Secondary | ICD-10-CM | POA: Diagnosis not present

## 2022-03-25 DIAGNOSIS — D225 Melanocytic nevi of trunk: Secondary | ICD-10-CM | POA: Diagnosis not present

## 2022-03-25 DIAGNOSIS — X32XXXA Exposure to sunlight, initial encounter: Secondary | ICD-10-CM | POA: Diagnosis not present

## 2022-03-25 DIAGNOSIS — D2261 Melanocytic nevi of right upper limb, including shoulder: Secondary | ICD-10-CM | POA: Diagnosis not present

## 2022-03-25 DIAGNOSIS — L918 Other hypertrophic disorders of the skin: Secondary | ICD-10-CM | POA: Diagnosis not present

## 2022-03-25 DIAGNOSIS — L738 Other specified follicular disorders: Secondary | ICD-10-CM | POA: Diagnosis not present

## 2022-03-25 DIAGNOSIS — D2272 Melanocytic nevi of left lower limb, including hip: Secondary | ICD-10-CM | POA: Diagnosis not present

## 2022-11-17 ENCOUNTER — Other Ambulatory Visit (HOSPITAL_COMMUNITY)
Admission: RE | Admit: 2022-11-17 | Discharge: 2022-11-17 | Disposition: A | Discharge status: Home / Self Care | Payer: 59 | Source: Ambulatory Visit | Attending: Obstetrics & Gynecology | Admitting: Obstetrics & Gynecology

## 2022-11-17 ENCOUNTER — Ambulatory Visit: Payer: 59 | Admitting: Obstetrics & Gynecology

## 2022-11-17 ENCOUNTER — Encounter: Payer: Self-pay | Admitting: Obstetrics & Gynecology

## 2022-11-17 VITALS — BP 140/80 | Ht 64.0 in | Wt 180.0 lb

## 2022-11-17 DIAGNOSIS — Z01419 Encounter for gynecological examination (general) (routine) without abnormal findings: Secondary | ICD-10-CM | POA: Insufficient documentation

## 2022-11-17 DIAGNOSIS — Z124 Encounter for screening for malignant neoplasm of cervix: Secondary | ICD-10-CM | POA: Insufficient documentation

## 2022-11-17 DIAGNOSIS — Z30432 Encounter for removal of intrauterine contraceptive device: Secondary | ICD-10-CM

## 2022-11-17 DIAGNOSIS — Z1231 Encounter for screening mammogram for malignant neoplasm of breast: Secondary | ICD-10-CM

## 2022-11-17 NOTE — Progress Notes (Signed)
GYNECOLOGY ANNUAL PHYSICAL EXAM PROGRESS NOTE  Subjective:    Emma Rush is a 42 y.o. married G2P1011 (19 yo son) who presents for an annual exam.  The patient is sexually active. The patient participates in regular exercise: yes. Has the patient ever been transfused or tattooed?: no. The patient reports that there is not domestic violence in her life.   The patient has the following complaints today: She is frustrated by her weight/gain. She would like her 42 yo Paragard IUD removed. Her husband had a vasectomy and is cleared. Her periods have become heavier over the last year.  Menstrual History: Patient's last menstrual period was 11/05/2022. Period Cycle (Days): 28 Period Duration (Days): 5-7 Period Pattern: (!) Irregular Menstrual Flow: Heavy, Moderate, Light Dysmenorrhea: None  Family history- no breast, gyn, colon, pancreas cancer      OB History  Gravida Para Term Preterm AB Living  2 1 1  0 1 1  SAB IAB Ectopic Multiple Live Births  0 0 1 0 0    # Outcome Date GA Lbr Len/2nd Weight Sex Type Anes PTL Lv  2 Ectopic           1 Term             Past Medical History:  Diagnosis Date   Ectopic pregnancy     No past surgical history on file.  Family History  Problem Relation Age of Onset   Bladder Cancer Paternal Grandfather 69    Social History   Socioeconomic History   Marital status: Married    Spouse name: Not on file   Number of children: Not on file   Years of education: Not on file   Highest education level: Not on file  Occupational History   Not on file  Tobacco Use   Smoking status: Never   Smokeless tobacco: Never  Substance and Sexual Activity   Alcohol use: No    Alcohol/week: 0.0 standard drinks of alcohol   Drug use: Never   Sexual activity: Yes    Birth control/protection: I.U.D.  Other Topics Concern   Not on file  Social History Narrative   Not on file   Social Determinants of Health   Financial Resource Strain:  Not on file  Food Insecurity: Not on file  Transportation Needs: Not on file  Physical Activity: Not on file  Stress: Not on file  Social Connections: Not on file  Intimate Partner Violence: Not on file    No current outpatient medications on file prior to visit.   No current facility-administered medications on file prior to visit.    No Known Allergies   Review of Systems  She denies dyspareunia. 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 Constitutional: negative for chills, fatigue, fevers and sweats Eyes: negative for irritation, redness and visual disturbance Ears, nose, mouth, throat, and face: negative for hearing loss, nasal congestion, snoring and tinnitus Respiratory: negative for asthma, cough, sputum Cardiovascular: negative for chest pain, dyspnea, exertional chest pressure/discomfort, irregular heart beat, palpitations and syncope Gastrointestinal: negative for abdominal pain, change in bowel habits, nausea and vomiting Genitourinary: negative for abnormal menstrual periods, genital lesions, sexual problems and vaginal discharge, dysuria and urinary incontinence Integument/breast: negative for breast lump, breast tenderness and nipple discharge Hematologic/lymphatic: negative for bleeding and easy bruising Musculoskeletal:negative for back pain and muscle weakness Neurological: negative for dizziness, headaches, vertigo and weakness Endocrine: negative for diabetic symptoms including polydipsia, polyuria and skin dryness Allergic/Immunologic: negative for hay fever and urticaria  Objective:  Blood pressure (!) 140/80, height 5\' 4"  (1.626 m), weight 180 lb (81.6 kg), last menstrual period 11/05/2022. Body mass index is 30.9 kg/m.    General Appearance:    Alert, cooperative, no distress, appears stated age  Head:    Normocephalic, without obvious abnormality, atraumatic  Eyes:    PERRL, conjunctiva/corneas clear, EOM's intact, both eyes  Ears:    Normal external ear canals,  both ears  Nose:   Nares normal, septum midline, mucosa normal, no drainage or sinus tenderness  Throat:   Lips, mucosa, and tongue normal; teeth and gums normal  Neck:   Supple, symmetrical, trachea midline, no adenopathy; thyroid: no enlargement/tenderness/nodules; no carotid bruit or JVD  Back:     Symmetric, no curvature, ROM normal, no CVA tenderness  Lungs:     Clear to auscultation bilaterally, respirations unlabored  Chest Wall:    No tenderness or deformity   Heart:    Regular rate and rhythm, S1 and S2 normal, no murmur, rub or gallop  Breast Exam:    No tenderness, masses, or nipple abnormality  Abdomen:     Soft, non-tender, bowel sounds active all four quadrants, no masses, no organomegaly.    Genitalia:    Pelvic:external genitalia normal except for large brown lesion with irregular borders, raised on left mons, vagina without lesions, discharge, or tenderness, rectovaginal septum  normal. Cervix normal in appearance, no cervical motion tenderness, no adnexal masses or tenderness.  Uterus normal size, shape, mobile, regular contours, nontender. Intact Paragard IUD easily removed. She tolerated this well.  Rectal:     No hemorrhoids appreciated. Internal exam not done.   Extremities:   Extremities normal, atraumatic, no cyanosis or edema  Pulses:   2+ and symmetric all extremities  Skin:   Skin color, texture, turgor normal, no rashes or lesions  Lymph nodes:   Cervical, supraclavicular, and axillary nodes normal  Neurologic:   CNII-XII intact, normal strength, sensation and reflexes throughout   .   Assessment:   1. Well woman exam with routine gynecological exam   2. Screening for cervical cancer   3. Encounter for screening mammogram for malignant neoplasm of breast   4.      Constipation- rec fiber, metamucil, miralax, magnesium citrate prn 5.      Skin lesion on mons - recommend removal in the near future  Plan:  Blood tests: routine labs. She will need to see her  primary care provider to care for abnormal values. She is aware of this. Breast self exam technique reviewed and patient encouraged to perform self-exam monthly. Discussed healthy lifestyle modifications. Mammogram ordered Pap smear ordered. Flu vaccine: declined Follow up in 1 year for annual exam   Allie Bossier, MD [facility]

## 2022-11-18 LAB — LIPID PANEL
Chol/HDL Ratio: 3.7 ratio (ref 0.0–4.4)
Cholesterol, Total: 221 mg/dL — ABNORMAL HIGH (ref 100–199)
HDL: 59 mg/dL (ref >39)
LDL Chol Calc (NIH): 148 mg/dL — ABNORMAL HIGH (ref 0–99)
Triglycerides: 79 mg/dL (ref 0–149)
VLDL Cholesterol Cal: 14 mg/dL (ref 5–40)

## 2022-11-18 LAB — COMPREHENSIVE METABOLIC PANEL
ALT: 17 [IU]/L (ref 0–32)
AST: 20 [IU]/L (ref 0–40)
Albumin: 4.5 g/dL (ref 3.9–4.9)
Alkaline Phosphatase: 64 [IU]/L (ref 44–121)
BUN/Creatinine Ratio: 16 (ref 9–23)
BUN: 12 mg/dL (ref 6–24)
Bilirubin Total: 0.7 mg/dL (ref 0.0–1.2)
CO2: 24 mmol/L (ref 20–29)
Calcium: 9.4 mg/dL (ref 8.7–10.2)
Chloride: 100 mmol/L (ref 96–106)
Creatinine, Ser: 0.75 mg/dL (ref 0.57–1.00)
Globulin, Total: 2.7 g/dL (ref 1.5–4.5)
Glucose: 79 mg/dL (ref 70–99)
Potassium: 3.9 mmol/L (ref 3.5–5.2)
Sodium: 138 mmol/L (ref 134–144)
Total Protein: 7.2 g/dL (ref 6.0–8.5)
eGFR: 101 mL/min/{1.73_m2} (ref >59)

## 2022-11-18 LAB — TSH+FREE T4
Free T4: 1.23 ng/dL (ref 0.82–1.77)
TSH: 1.5 u[IU]/mL (ref 0.450–4.500)

## 2022-11-18 LAB — CBC
Hematocrit: 43.6 % (ref 34.0–46.6)
Hemoglobin: 14.7 g/dL (ref 11.1–15.9)
MCH: 30.6 pg (ref 26.6–33.0)
MCHC: 33.7 g/dL (ref 31.5–35.7)
MCV: 91 fL (ref 79–97)
Platelets: 285 10*3/uL (ref 150–450)
RBC: 4.8 x10E6/uL (ref 3.77–5.28)
RDW: 12.6 % (ref 11.7–15.4)
WBC: 5.5 10*3/uL (ref 3.4–10.8)

## 2022-11-18 LAB — HEMOGLOBIN A1C
Est. average glucose Bld gHb Est-mCnc: 103 mg/dL
Hgb A1c MFr Bld: 5.2 % (ref 4.8–5.6)

## 2022-11-18 LAB — CYTOLOGY - PAP
Comment: NEGATIVE
Diagnosis: NEGATIVE
High risk HPV: NEGATIVE

## 2022-11-18 LAB — VITAMIN D 25 HYDROXY (VIT D DEFICIENCY, FRACTURES): Vit D, 25-Hydroxy: 53.9 ng/mL (ref 30.0–100.0)

## 2022-11-23 ENCOUNTER — Other Ambulatory Visit (HOSPITAL_COMMUNITY)
Admission: RE | Admit: 2022-11-23 | Discharge: 2022-11-23 | Disposition: A | Discharge status: Home / Self Care | Payer: 59 | Source: Ambulatory Visit | Attending: Obstetrics & Gynecology | Admitting: Obstetrics & Gynecology

## 2022-11-23 ENCOUNTER — Ambulatory Visit (INDEPENDENT_AMBULATORY_CARE_PROVIDER_SITE_OTHER): Payer: 59 | Admitting: Obstetrics & Gynecology

## 2022-11-23 VITALS — BP 172/103 | HR 67 | Wt 185.1 lb

## 2022-11-23 DIAGNOSIS — L821 Other seborrheic keratosis: Secondary | ICD-10-CM | POA: Diagnosis not present

## 2022-11-23 DIAGNOSIS — N9089 Other specified noninflammatory disorders of vulva and perineum: Secondary | ICD-10-CM | POA: Diagnosis not present

## 2022-11-23 NOTE — Progress Notes (Signed)
    GYNECOLOGY PROGRESS NOTE  Subjective:    Patient ID: Emma Rush, female    DOB: 04-Dec-1980, 42 y.o.   MRN: [id number]  HPI  Patient is a 42 y.o. G2P1011 here today to have a lesion from her left mons removed. She noticed it about 10 years ago. I saw it for the first time at our visit last week.  The following portions of the patient's history were reviewed and updated as appropriate: allergies, current medications, past family history, past medical history, past social history, past surgical history, and problem list.  Review of Systems Pertinent items are noted in HPI.   Objective:   Blood pressure (!) 151/93, pulse 87, weight 185 lb 1.6 oz (84 kg), last menstrual period 11/05/2022. Body mass index is 31.77 kg/m. Well nourished, well hydrated White female, no apparent distress She is ambulating and conversing normally. There is a 2.5 cm brown lesion on the left mons. It is raised with irregular borders. I prepped with iodine and then sprayed Hurricaine spray. I then injected 2 mL of lidocaine. I used a scapel to remove the entire lesion. I used silver nitrate to achieve hemostasis. For added security, I cauterized the edges with the LEEP machine (ball tip).    Assessment:   Vulvar lesion  Plan:   Await pathology If it is not benign, then I will refer her to gyn onc. She is aware of this plan.

## 2022-11-25 LAB — SURGICAL PATHOLOGY

## 2022-11-30 ENCOUNTER — Encounter: Payer: Self-pay | Admitting: Obstetrics & Gynecology

## 2023-01-30 ENCOUNTER — Encounter: Payer: Self-pay | Admitting: *Deleted

## 2023-01-30 ENCOUNTER — Ambulatory Visit
Admission: EM | Admit: 2023-01-30 | Discharge: 2023-01-30 | Disposition: A | Discharge status: Home / Self Care | Payer: 59 | Attending: Emergency Medicine | Admitting: Emergency Medicine

## 2023-01-30 ENCOUNTER — Other Ambulatory Visit: Payer: Self-pay

## 2023-01-30 DIAGNOSIS — J069 Acute upper respiratory infection, unspecified: Secondary | ICD-10-CM

## 2023-01-30 LAB — POC COVID19/FLU A&B COMBO
Covid Antigen, POC: NEGATIVE
Influenza A Antigen, POC: NEGATIVE
Influenza B Antigen, POC: NEGATIVE

## 2023-01-30 NOTE — ED Triage Notes (Signed)
 C/O chest congestion "with crackling sound in my chest", fatigue, joint pain, fever onset yesterday. Denies cough. Has taken Mucinex.

## 2023-01-30 NOTE — ED Provider Notes (Signed)
 [facility]    CSN: [id number] Arrival date & time: 01/30/23  1041      History   Chief Complaint Chief Complaint  Patient presents with   Chest Congestion   Joint Pain    HPI Emma Rush is a 43 y.o. female.   Patient presents for evaluation of increased fatigue, nasal congestion, chest congestion with a crackling and  purr upon awakening, body aches primarily affecting the legs, left-sided frontal headache present for 1 day.  Endorses fever peaking at 99.9 and chills.  Mucus feels thick with postnasal drip.  Has attempted use of Mucinex.  Decreased appetite.  No known sick contacts but did attend a large gathering earlier this week.  Denies shortness of breath or wheeze.   Past Medical History:  Diagnosis Date   Ectopic pregnancy     There are no active problems to display for this patient.   Past Surgical History:  Procedure Laterality Date   UNILATERAL SALPINGECTOMY      OB History     Gravida  2   Para  1   Term  1   Preterm      AB  1   Living  1      SAB      IAB      Ectopic  1   Multiple      Live Births               Home Medications    Prior to Admission medications   Not on File    Family History Family History  Problem Relation Age of Onset   Bladder Cancer Paternal Grandfather 45    Social History Social History   Tobacco Use   Smoking status: Never   Smokeless tobacco: Never  Vaping Use   Vaping status: Never Used  Substance Use Topics   Alcohol use: No    Alcohol/week: 0.0 standard drinks of alcohol   Drug use: Never     Allergies   Patient has no known allergies.   Review of Systems Review of Systems   Physical Exam Triage Vital Signs ED Triage Vitals  Encounter Vitals Group     BP 01/30/23 1137 (!) 187/99     Systolic BP Percentile --      Diastolic BP Percentile --      Pulse Rate 01/30/23 1137 (!) 108     Resp 01/30/23 1137 16     Temp 01/30/23 1137 (!) 97.3 F (36.3 C)      Temp Source 01/30/23 1137 Temporal     SpO2 01/30/23 1137 99 %     Weight --      Height --      Head Circumference --      Peak Flow --      Pain Score 01/30/23 1138 7     Pain Loc --      Pain Education --      Exclude from Growth Chart --    No data found.  Updated Vital Signs BP (!) 187/99 (BP Location: Left Arm)   Pulse (!) 108   Temp (!) 97.3 F (36.3 C) (Temporal)   Resp 16   LMP 01/17/2023 (Approximate)   SpO2 99%   Visual Acuity Right Eye Distance:   Left Eye Distance:   Bilateral Distance:    Right Eye Near:   Left Eye Near:    Bilateral Near:     Physical Exam Constitutional:  Appearance: Normal appearance.  HENT:     Head: Normocephalic.     Right Ear: Tympanic membrane, ear canal and external ear normal.     Left Ear: Tympanic membrane, ear canal and external ear normal.     Nose: Congestion present. No rhinorrhea.     Mouth/Throat:     Mouth: Mucous membranes are moist.     Pharynx: Oropharynx is clear. No oropharyngeal exudate or posterior oropharyngeal erythema.  Eyes:     Extraocular Movements: Extraocular movements intact.  Cardiovascular:     Rate and Rhythm: Normal rate and regular rhythm.     Pulses: Normal pulses.     Heart sounds: Normal heart sounds.  Pulmonary:     Effort: Pulmonary effort is normal.     Breath sounds: Normal breath sounds.  Musculoskeletal:     Cervical back: Normal range of motion and neck supple.  Neurological:     Mental Status: She is alert and oriented to person, place, and time. Mental status is at baseline.      UC Treatments / Results  Labs (all labs ordered are listed, but only abnormal results are displayed) Labs Reviewed  POC COVID19/FLU A&B COMBO    EKG   Radiology No results found.  Procedures Procedures (including critical care time)  Medications Ordered in UC Medications - No data to display  Initial Impression / Assessment and Plan / UC Course  I have reviewed the triage  vital signs and the nursing notes.  Pertinent labs & imaging results that were available during my care of the patient were reviewed by me and considered in my medical decision making (see chart for details).  Viral URI with cough  Patient is in no signs of distress nor toxic appearing.  Vital signs are stable.  Low suspicion for pneumonia, pneumothorax or bronchitis and therefore will defer imaging.  Covid and  flu test negative.May use additional over-the-counter medications as needed for supportive care.  May follow-up with urgent care as needed if symptoms persist or worsen.   Final Clinical Impressions(s) / UC Diagnoses   Final diagnoses:  None   Discharge Instructions   None    ED Prescriptions   None    PDMP not reviewed this encounter.   Teresa Shelba SAUN[place], [place] 01/30/23 [phone]

## 2023-01-30 NOTE — Discharge Instructions (Signed)
 Your symptoms today are most likely being caused by a virus and should steadily improve in time it can take up to 7 to 10 days before you truly start to see a turnaround however things will get better  COVID and flu test negative    You can take Tylenol and/or Ibuprofen as needed for fever reduction and pain relief.   For cough: honey 1/2 to 1 teaspoon (you can dilute the honey in water or another fluid).  You can also use guaifenesin and dextromethorphan for cough. You can use a humidifier for chest congestion and cough.  If you don't have a humidifier, you can sit in the bathroom with the hot shower running.      For sore throat: try warm salt water gargles, cepacol lozenges, throat spray, warm tea or water with lemon/honey, popsicles or ice, or OTC cold relief medicine for throat discomfort.   For congestion: take a daily anti-histamine like Zyrtec, Claritin, and a oral decongestant, such as pseudoephedrine.  You can also use Flonase 1-2 sprays in each nostril daily.   It is important to stay hydrated: drink plenty of fluids (water, gatorade/powerade/pedialyte, juices, or teas) to keep your throat moisturized and help further relieve irritation/discomfort.

## 2023-02-01 ENCOUNTER — Ambulatory Visit (INDEPENDENT_AMBULATORY_CARE_PROVIDER_SITE_OTHER): Payer: 59

## 2023-02-01 ENCOUNTER — Ambulatory Visit
Admission: EM | Admit: 2023-02-01 | Discharge: 2023-02-01 | Disposition: A | Discharge status: Home / Self Care | Payer: 59 | Attending: Emergency Medicine | Admitting: Emergency Medicine

## 2023-02-01 DIAGNOSIS — R051 Acute cough: Secondary | ICD-10-CM

## 2023-02-01 DIAGNOSIS — B349 Viral infection, unspecified: Secondary | ICD-10-CM | POA: Diagnosis not present

## 2023-02-01 DIAGNOSIS — R0602 Shortness of breath: Secondary | ICD-10-CM

## 2023-02-01 DIAGNOSIS — R112 Nausea with vomiting, unspecified: Secondary | ICD-10-CM | POA: Diagnosis not present

## 2023-02-01 DIAGNOSIS — R1110 Vomiting, unspecified: Secondary | ICD-10-CM | POA: Diagnosis not present

## 2023-02-01 DIAGNOSIS — R059 Cough, unspecified: Secondary | ICD-10-CM | POA: Diagnosis not present

## 2023-02-01 MED ORDER — ONDANSETRON 4 MG PO TBDP: 4.0000 mg | ORAL_TABLET | Freq: Three times a day (TID) | ORAL | 0 refills | Status: AC | PRN

## 2023-02-01 NOTE — ED Provider Notes (Signed)
 Emma Rush    CSN: [id number] Arrival date & time: 02/01/23  0846      History   Chief Complaint Chief Complaint  Patient presents with   Fever    HPI Emma Rush is a 43 y.o. female.  Patient presents with 3-day history of fever, congestion, cough, shortness of breath, body aches, fatigue.  She is concerned for pneumonia.  Tmax 101.9.  Treating with ibuprofen this morning and Tylenol last night.  She vomited x 3 last night.  No diarrhea.  Patient was seen at this urgent care on 01/30/2023; negative for flu and COVID.   The history is provided by the patient and medical records.    Past Medical History:  Diagnosis Date   Ectopic pregnancy     There are no active problems to display for this patient.   Past Surgical History:  Procedure Laterality Date   UNILATERAL SALPINGECTOMY      OB History     Gravida  2   Para  1   Term  1   Preterm      AB  1   Living  1      SAB      IAB      Ectopic  1   Multiple      Live Births               Home Medications    Prior to Admission medications   Medication Sig Start Date End Date Taking? Authorizing Provider  ondansetron  (ZOFRAN -ODT) 4 MG disintegrating tablet Take 1 tablet (4 mg total) by mouth every 8 (eight) hours as needed for nausea or vomiting. 02/01/23  Yes Corlis Burnard DEL, NP    Family History Family History  Problem Relation Age of Onset   Bladder Cancer Paternal Grandfather 15    Social History Social History   Tobacco Use   Smoking status: Never   Smokeless tobacco: Never  Vaping Use   Vaping status: Never Used  Substance Use Topics   Alcohol use: No    Alcohol/week: 0.0 standard drinks of alcohol   Drug use: Never     Allergies   Patient has no known allergies.   Review of Systems Review of Systems  Constitutional:  Positive for fatigue and fever.  HENT:  Positive for congestion. Negative for ear pain and sore throat.   Respiratory:  Positive for cough  and shortness of breath.   Cardiovascular:  Negative for chest pain and palpitations.  Gastrointestinal:  Positive for nausea and vomiting. Negative for abdominal pain and diarrhea.     Physical Exam Triage Vital Signs ED Triage Vitals  Encounter Vitals Group     BP 02/01/23 0859 136/89     Systolic BP Percentile --      Diastolic BP Percentile --      Pulse Rate 02/01/23 0855 (!) 104     Resp 02/01/23 0855 18     Temp 02/01/23 0855 99.2 F (37.3 C)     Temp src --      SpO2 02/01/23 0855 96 %     Weight --      Height --      Head Circumference --      Peak Flow --      Pain Score 02/01/23 0855 5     Pain Loc --      Pain Education --      Exclude from Growth Chart --    No data  found.  Updated Vital Signs BP 136/89   Pulse (!) 104   Temp 99.2 F (37.3 C)   Resp 18   LMP 01/17/2023 (Approximate)   SpO2 96%   Visual Acuity Right Eye Distance:   Left Eye Distance:   Bilateral Distance:    Right Eye Near:   Left Eye Near:    Bilateral Near:     Physical Exam Constitutional:      General: She is not in acute distress.    Appearance: She is ill-appearing.  HENT:     Right Ear: Tympanic membrane normal.     Left Ear: Tympanic membrane normal.     Nose: Nose normal.     Mouth/Throat:     Mouth: Mucous membranes are moist.     Pharynx: Oropharynx is clear.  Cardiovascular:     Rate and Rhythm: Normal rate and regular rhythm.     Heart sounds: Normal heart sounds.  Pulmonary:     Effort: Pulmonary effort is normal. No respiratory distress.     Breath sounds: Normal breath sounds.  Abdominal:     General: Bowel sounds are normal.     Palpations: Abdomen is soft.     Tenderness: There is no abdominal tenderness. There is no guarding or rebound.  Neurological:     Mental Status: She is alert.      UC Treatments / Results  Labs (all labs ordered are listed, but only abnormal results are displayed) Labs Reviewed - No data to  display  EKG   Radiology DG Chest 2 View Result Date: 02/01/2023 CLINICAL DATA:  Cough and shortness of breath for several days. Vomiting. EXAM: CHEST - 2 VIEW COMPARISON:  None Available. FINDINGS: The heart size and mediastinal contours are within normal limits. Both lungs are clear. The visualized skeletal structures are unremarkable. IMPRESSION: No active cardiopulmonary disease. Electronically Signed   By: Norleen DELENA Kil M.D.   On: 02/01/2023 09:42    Procedures Procedures (including critical care time)  Medications Ordered in UC Medications - No data to display  Initial Impression / Assessment and Plan / UC Course  I have reviewed the triage vital signs and the nursing notes.  Pertinent labs & imaging results that were available during my care of the patient were reviewed by me and considered in my medical decision making (see chart for details).   Cough, shortness of breath, nausea and vomiting, viral illness.  CXR negative.  Treating nausea and vomiting with Zofran .  Education provided on cough, shortness of breath, nausea and vomiting, viral illness.  Instructed patient to follow-up with her PCP.  ED precautions discussed.  She agrees to plan of care.   Final Clinical Impressions(s) / UC Diagnoses   Final diagnoses:  Acute cough  SOB (shortness of breath)  Nausea and vomiting, unspecified vomiting type  Viral illness     Discharge Instructions      Your chest xray is negative.    Take the antinausea medication as directed.    Keep yourself hydrated with clear liquids, such as water.    Go to the emergency department if you have worsening symptoms.    Follow up with your primary care provider.          ED Prescriptions     Medication Sig Dispense Auth. Provider   ondansetron  (ZOFRAN -ODT) 4 MG disintegrating tablet Take 1 tablet (4 mg total) by mouth every 8 (eight) hours as needed for nausea or vomiting. 20 tablet Corlis Burnard DEL,  NP      PDMP not  reviewed this encounter.   Corlis Burnard DEL, NP 02/01/23 [phone]

## 2023-02-01 NOTE — ED Triage Notes (Signed)
 Patient to Urgent Care with complaints of fevers/ nasal congestion/ chest congestion/ SHOB- crackling in her chest/ weakness and fatigue/ strange taste in my mouth/ three episodes of emesis last night.  Reports symptoms started Friday afternoon. Woke up feeling worse on Saturday- seen here with a negative covid/ flu test. States she has been unable to break a fever.   Meds: ibuprofen PTA/ tylenol.

## 2023-02-01 NOTE — Discharge Instructions (Addendum)
 Your chest xray is negative.    Take the antinausea medication as directed.    Keep yourself hydrated with clear liquids, such as water.    Go to the emergency department if you have worsening symptoms.    Follow up with your primary care provider.

## 2023-02-25 ENCOUNTER — Other Ambulatory Visit (HOSPITAL_COMMUNITY)
Admission: RE | Admit: 2023-02-25 | Discharge: 2023-02-25 | Disposition: A | Discharge status: Home / Self Care | Payer: 59 | Source: Ambulatory Visit | Attending: Certified Nurse Midwife | Admitting: Certified Nurse Midwife

## 2023-02-25 ENCOUNTER — Encounter: Payer: Self-pay | Admitting: Certified Nurse Midwife

## 2023-02-25 ENCOUNTER — Ambulatory Visit: Payer: 59 | Admitting: Certified Nurse Midwife

## 2023-02-25 VITALS — BP 155/84 | HR 69 | Ht 63.5 in | Wt 182.0 lb

## 2023-02-25 DIAGNOSIS — N898 Other specified noninflammatory disorders of vagina: Secondary | ICD-10-CM | POA: Diagnosis not present

## 2023-02-25 DIAGNOSIS — R030 Elevated blood-pressure reading, without diagnosis of hypertension: Secondary | ICD-10-CM

## 2023-02-25 DIAGNOSIS — B3731 Acute candidiasis of vulva and vagina: Secondary | ICD-10-CM | POA: Diagnosis not present

## 2023-02-25 DIAGNOSIS — R300 Dysuria: Secondary | ICD-10-CM | POA: Diagnosis not present

## 2023-02-25 MED ORDER — FLUCONAZOLE 150 MG PO TABS
150.0000 mg | ORAL_TABLET | ORAL | 0 refills | Status: AC
Start: 2023-02-25 — End: 2023-03-01

## 2023-02-26 NOTE — Progress Notes (Unsigned)
    Pcp, No   Chief Complaint  Patient presents with   Vaginitis    Itching X1 week,some burning after urination     HPI:      Emma Rush is a 43 y.o. G2P1011 whose LMP was Patient's last menstrual period was 02/07/2023 (approximate)., presents today for vaginal, labial    There are no active problems to display for this patient.   Past Surgical History:  Procedure Laterality Date   UNILATERAL SALPINGECTOMY      Family History  Problem Relation Age of Onset   Bladder Cancer Paternal Grandfather 79    Social History   Socioeconomic History   Marital status: Married    Spouse name: Not on file   Number of children: Not on file   Years of education: Not on file   Highest education level: Not on file  Occupational History   Not on file  Tobacco Use   Smoking status: Never   Smokeless tobacco: Never  Vaping Use   Vaping status: Never Used  Substance and Sexual Activity   Alcohol use: No    Alcohol/week: 0.0 standard drinks of alcohol   Drug use: Never   Sexual activity: Yes    Comment: vasectomy in spouse  Other Topics Concern   Not on file  Social History Narrative   Not on file   Social Drivers of Health   Financial Resource Strain: Not on file  Food Insecurity: Not on file  Transportation Needs: Not on file  Physical Activity: Not on file  Stress: Not on file  Social Connections: Not on file  Intimate Partner Violence: Not on file    Outpatient Medications Prior to Visit  Medication Sig Dispense Refill   ondansetron (ZOFRAN-ODT) 4 MG disintegrating tablet Take 1 tablet (4 mg total) by mouth every 8 (eight) hours as needed for nausea or vomiting. (Patient not taking: Reported on 02/25/2023) 20 tablet 0   No facility-administered medications prior to visit.      ROS:  Review of Systems   OBJECTIVE:   Vitals:  BP (!) 155/84   Pulse 69   Ht 5' 3.5" (1.613 m)   Wt 182 lb (82.6 kg)   LMP 02/07/2023 (Approximate)   BMI 31.73 kg/m    Physical Exam  Results: No results found for this or any previous visit (from the past 24 hours).   Assessment/Plan: Vaginal itching - Plan: Cervicovaginal ancillary only  Vaginal irritation - Plan: Cervicovaginal ancillary only  Dysuria - Plan: Cervicovaginal ancillary only, Urine Culture    Meds ordered this encounter  Medications   fluconazole (DIFLUCAN) 150 MG tablet    Sig: Take 1 tablet (150 mg total) by mouth every 3 (three) days for 2 doses.    Dispense:  2 tablet    Refill:  0     Dominica Severin, CNM 02/26/2023 3:38 PM

## 2023-02-27 LAB — URINE CULTURE

## 2023-03-01 ENCOUNTER — Encounter: Payer: Self-pay | Admitting: Certified Nurse Midwife

## 2023-03-01 LAB — CERVICOVAGINAL ANCILLARY ONLY
Bacterial Vaginitis (gardnerella): NEGATIVE
Candida Glabrata: NEGATIVE
Candida Vaginitis: POSITIVE — AB
Comment: NEGATIVE
Comment: NEGATIVE
Comment: NEGATIVE

## 2023-08-12 ENCOUNTER — Ambulatory Visit
Admission: EM | Admit: 2023-08-12 | Discharge: 2023-08-12 | Disposition: A | Discharge status: Home / Self Care | Attending: Emergency Medicine | Admitting: Emergency Medicine

## 2023-08-12 DIAGNOSIS — S0990XA Unspecified injury of head, initial encounter: Secondary | ICD-10-CM | POA: Diagnosis not present

## 2023-08-12 DIAGNOSIS — Z23 Encounter for immunization: Secondary | ICD-10-CM

## 2023-08-12 DIAGNOSIS — S0101XA Laceration without foreign body of scalp, initial encounter: Secondary | ICD-10-CM | POA: Diagnosis not present

## 2023-08-12 MED ORDER — TETANUS-DIPHTH-ACELL PERTUSSIS 5-2.5-18.5 LF-MCG/0.5 IM SUSY
0.5000 mL | PREFILLED_SYRINGE | Freq: Once | INTRAMUSCULAR | Status: AC
  Administered 2023-08-12: 0.5 mL via INTRAMUSCULAR

## 2023-08-12 NOTE — Discharge Instructions (Addendum)
 See the attached information on head injury and laceration care.    Go to the emergency department right away if you have concerning symptoms.  Keep your wound clean and dry.  Wash it gently twice a day with soap and water.  Apply an antibiotic ointment.  Follow-up right away if you see signs of infection such as pus, fever, redness.    Tetanus was updated today.

## 2023-08-12 NOTE — ED Provider Notes (Addendum)
 [facility]    CSN: [id number] Arrival date & time: 08/12/23  1600      History   Chief Complaint Chief Complaint  Patient presents with   Laceration    HPI Emma Rush is a 43 y.o. female.  Accompanied by her husband, patient presents with a laceration on the back of her head which occurred approximately 1.5 hours ago.  She was at home on a 2 step ladder when she missed a step and accidentally fell backward.  She hit her head on her son's dresser.  She denies loss of consciousness.  She has been treating the laceration with direct pressure and ice pack.  She denies vision change, dizziness, numbness, weakness, neck pain, back pain, chest pain, shortness of breath.  She is not on anticoagulants.  Last tetanus unknown.  The history is provided by the patient, the spouse and medical records.    Past Medical History:  Diagnosis Date   Ectopic pregnancy     There are no active problems to display for this patient.   Past Surgical History:  Procedure Laterality Date   UNILATERAL SALPINGECTOMY      OB History     Gravida  2   Para  1   Term  1   Preterm      AB  1   Living  1      SAB      IAB      Ectopic  1   Multiple      Live Births               Home Medications    Prior to Admission medications   Medication Sig Start Date End Date Taking? Authorizing Provider  ondansetron  (ZOFRAN -ODT) 4 MG disintegrating tablet Take 1 tablet (4 mg total) by mouth every 8 (eight) hours as needed for nausea or vomiting. Patient not taking: Reported on 02/25/2023 02/01/23   Corlis Burnard DEL, NP    Family History Family History  Problem Relation Age of Onset   Bladder Cancer Paternal Grandfather 32    Social History Social History   Tobacco Use   Smoking status: Never   Smokeless tobacco: Never  Vaping Use   Vaping status: Never Used  Substance Use Topics   Alcohol use: No    Alcohol/week: 0.0 standard drinks of alcohol   Drug use: Never      Allergies   Patient has no known allergies.   Review of Systems Review of Systems  Eyes:  Negative for visual disturbance.  Musculoskeletal:  Negative for back pain, gait problem and neck pain.  Skin:  Positive for wound. Negative for color change.  Neurological:  Negative for dizziness, syncope, facial asymmetry, weakness, light-headedness, numbness and headaches.     Physical Exam Triage Vital Signs ED Triage Vitals  Encounter Vitals Group     BP      Girls Systolic BP Percentile      Girls Diastolic BP Percentile      Boys Systolic BP Percentile      Boys Diastolic BP Percentile      Pulse      Resp      Temp      Temp src      SpO2      Weight      Height      Head Circumference      Peak Flow      Pain Score      Pain  Loc      Pain Education      Exclude from Growth Chart    No data found.  Updated Vital Signs BP 138/88   Pulse 80   Temp 97.7 F (36.5 C)   Resp 18   LMP 08/08/2023   SpO2 98%   Visual Acuity Right Eye Distance:   Left Eye Distance:   Bilateral Distance:    Right Eye Near:   Left Eye Near:    Bilateral Near:     Physical Exam Constitutional:      General: She is not in acute distress. HENT:     Right Ear: Tympanic membrane normal.     Left Ear: Tympanic membrane normal.     Nose: Nose normal.     Mouth/Throat:     Mouth: Mucous membranes are moist.     Pharynx: Oropharynx is clear.  Eyes:     Pupils: Pupils are equal, round, and reactive to light.  Cardiovascular:     Rate and Rhythm: Normal rate and regular rhythm.     Heart sounds: Normal heart sounds.  Pulmonary:     Effort: Pulmonary effort is normal. No respiratory distress.     Breath sounds: Normal breath sounds.  Musculoskeletal:        General: No deformity. Normal range of motion.     Cervical back: Normal range of motion and neck supple. No tenderness.     Comments: Neck and spine nontender.  Skin:    General: Skin is warm and dry.     Findings:  Lesion present.     Comments: 1 cm superficial laceration on posterior scalp with small hematoma. No active bleeding.   Neurological:     General: No focal deficit present.     Mental Status: She is alert and oriented to person, place, and time.     Cranial Nerves: No cranial nerve deficit.     Sensory: No sensory deficit.     Motor: No weakness.     Gait: Gait normal.  Psychiatric:        Mood and Affect: Mood normal.        Behavior: Behavior normal.      UC Treatments / Results  Labs (all labs ordered are listed, but only abnormal results are displayed) Labs Reviewed - No data to display  EKG   Radiology No results found.  Procedures Procedures (including critical care time)  Medications Ordered in UC Medications  Tdap (BOOSTRIX ) injection 0.5 mL (0.5 mLs Intramuscular Given 08/12/23 1628)    Initial Impression / Assessment and Plan / UC Course  I have reviewed the triage vital signs and the nursing notes.  Pertinent labs & imaging results that were available during my care of the patient were reviewed by me and considered in my medical decision making (see chart for details).   Scalp laceration, head injury.  Discussed limitations of evaluation of a head injury in an urgent care setting.  Patient declines transfer to the ED.  Afebrile, vital signs are stable.  Wound cleaned and topical antibiotic ointment applied.  No indication for wound closure at this time as it is quite superficial.  Wound care instructions and signs of infection discussed.  Tetanus updated.  Education provided on nonsutured laceration care and head injury.  Strict ED precautions discussed with patient and her husband at length.  They agree to plan of care.  Final Clinical Impressions(s) / UC Diagnoses   Final diagnoses:  Laceration of scalp,  initial encounter  Injury of head, initial encounter     Discharge Instructions      See the attached information on head injury and laceration care.     Go to the emergency department right away if you have concerning symptoms.  Keep your wound clean and dry.  Wash it gently twice a day with soap and water.  Apply an antibiotic ointment.  Follow-up right away if you see signs of infection such as pus, fever, redness.    Tetanus was updated today.     ED Prescriptions   None    PDMP not reviewed this encounter.   Corlis Burnard DEL, NP 08/12/23 1637    Corlis Burnard DEL, NP 08/12/23 [phone]

## 2023-08-12 NOTE — ED Triage Notes (Addendum)
 Patient to Urgent Care with complaints of a laceration to her posterior head after falling 1-2 steps off a small step stool and striking the back of her head on a desk this afternoon.  Denies LOC/ no blood thinner usage. Using an ice pack.  TDAP> 10 years ago.
